# Patient Record
Sex: Female | Born: 1985 | Race: White | Hispanic: No | Marital: Married | Smoking: Former smoker
Health system: Southern US, Community
[De-identification: ages and names within clinical notes are randomized; demographics above are authoritative.]

## PROBLEM LIST (undated history)

## (undated) DIAGNOSIS — I10 Essential (primary) hypertension: Secondary | ICD-10-CM

## (undated) DIAGNOSIS — E559 Vitamin D deficiency, unspecified: Secondary | ICD-10-CM

## (undated) DIAGNOSIS — O24419 Gestational diabetes mellitus in pregnancy, unspecified control: Secondary | ICD-10-CM

## (undated) DIAGNOSIS — I499 Cardiac arrhythmia, unspecified: Secondary | ICD-10-CM

## (undated) DIAGNOSIS — K219 Gastro-esophageal reflux disease without esophagitis: Secondary | ICD-10-CM

## (undated) HISTORY — DX: Gastro-esophageal reflux disease without esophagitis: K21.9

## (undated) HISTORY — PX: NO PAST SURGERIES: SHX2092

## (undated) HISTORY — DX: Gestational diabetes mellitus in pregnancy, unspecified control: O24.419

## (undated) HISTORY — DX: Cardiac arrhythmia, unspecified: I49.9

## (undated) HISTORY — DX: Vitamin D deficiency, unspecified: E55.9

## (undated) HISTORY — DX: Essential (primary) hypertension: I10

---

## 2006-11-14 ENCOUNTER — Emergency Department: Payer: Self-pay | Admitting: Emergency Medicine

## 2012-01-13 ENCOUNTER — Ambulatory Visit: Payer: Self-pay | Admitting: Internal Medicine

## 2012-12-26 IMAGING — US US PELV - US TRANSVAGINAL
1 series · 17 of 25 positions shown · non-contrast
Comparison: none

REASON FOR EXAM: RLQ pain
COMMENTS:

[Series 1: us pelv - us transvaginal · 17 of 71 slices shown]
[im 1/71]
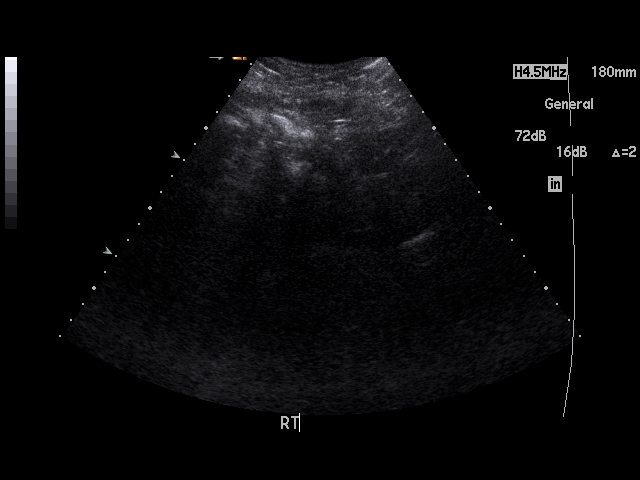
[im 6/71]
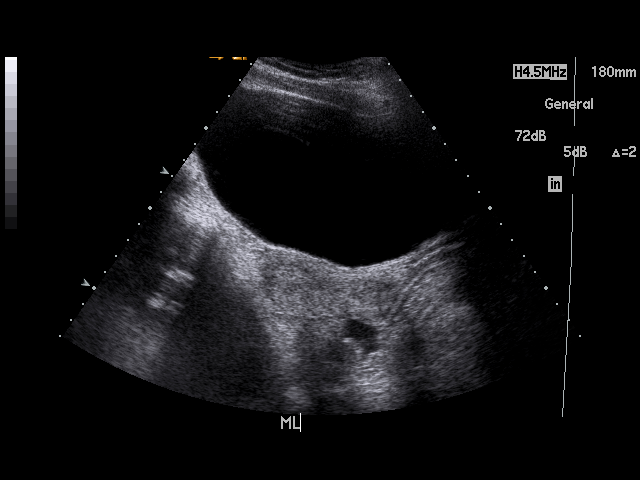
[im 9/71]
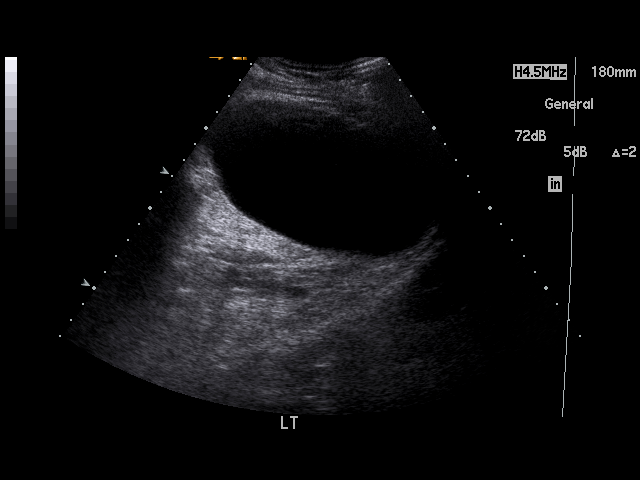
[im 15/71]
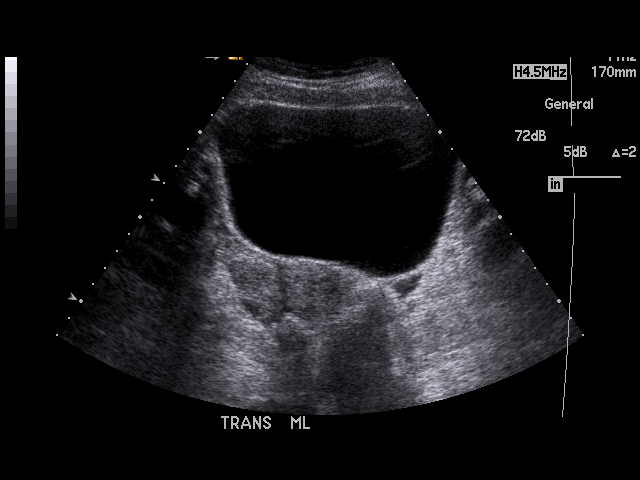
[im 18/71]
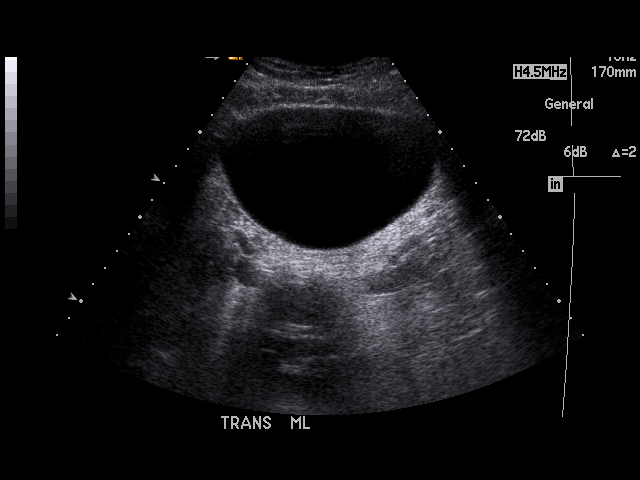
[im 24/71]
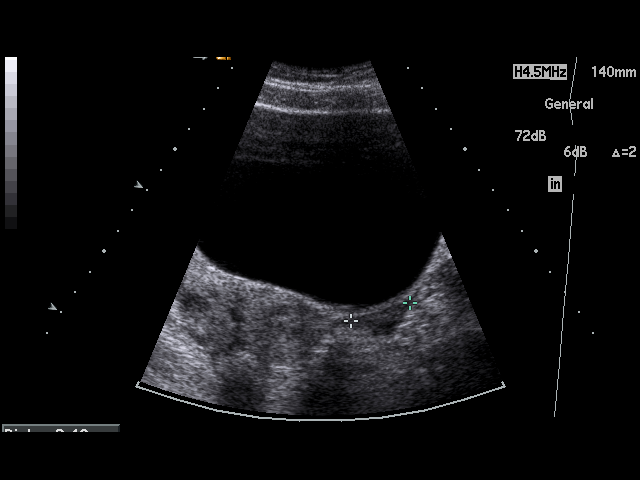
[im 27/71]
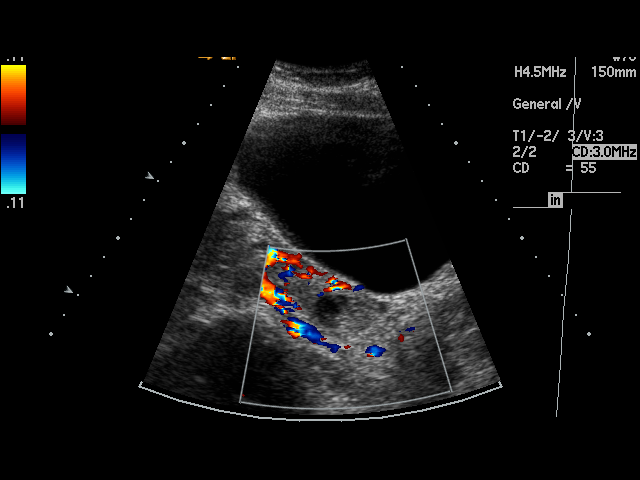
[im 33/71]
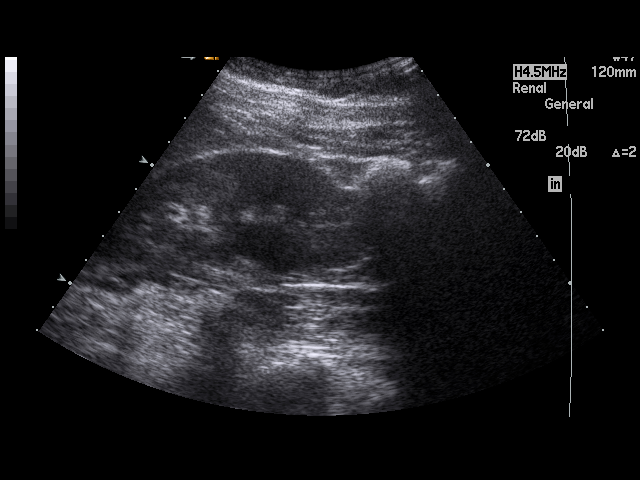
[im 36/71]
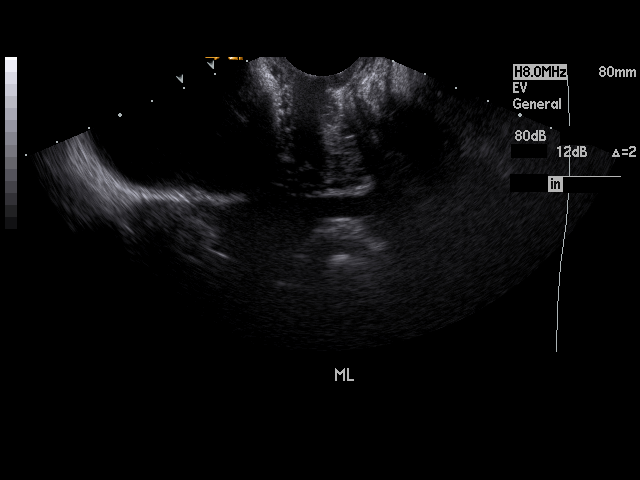
[im 38/71]
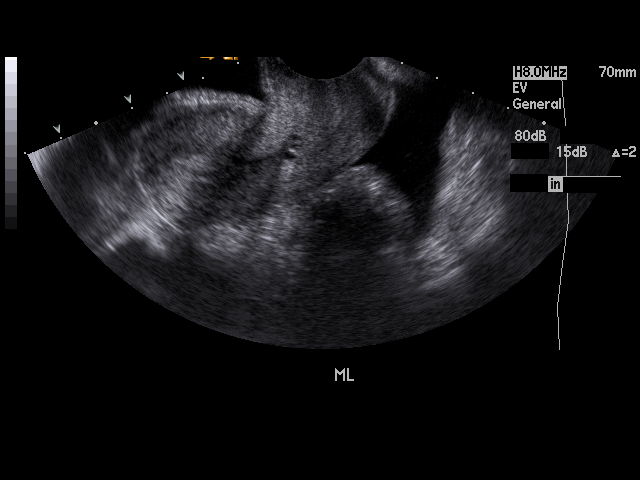
[im 44/71]
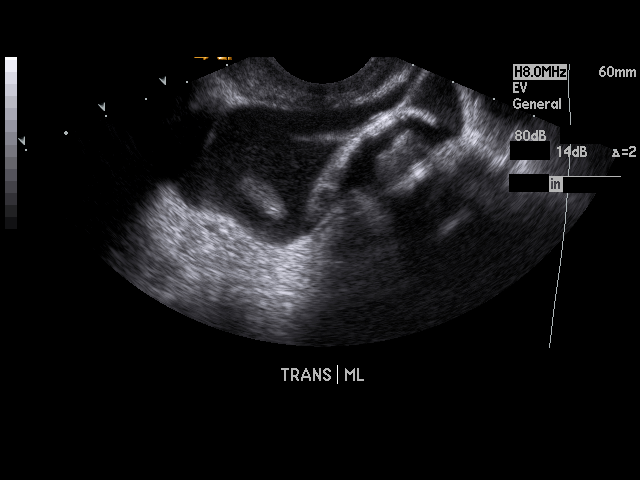
[im 47/71]
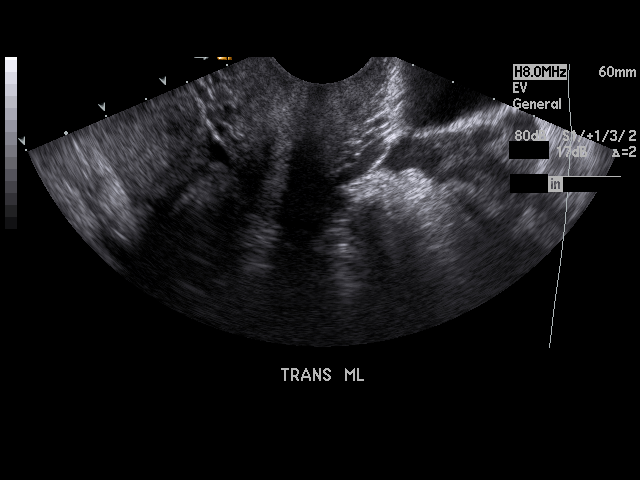
[im 53/71]
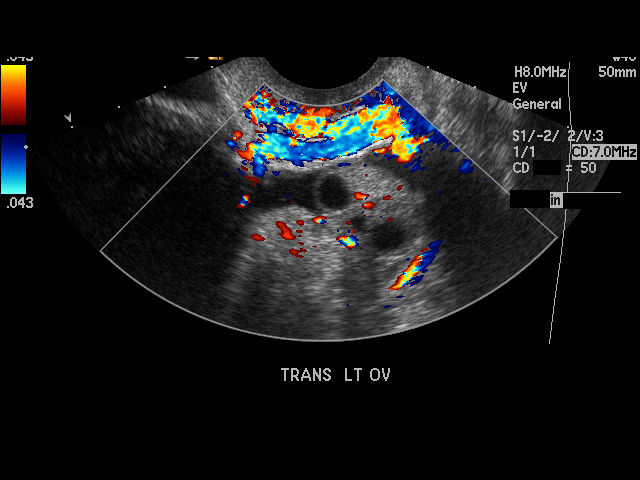
[im 56/71]
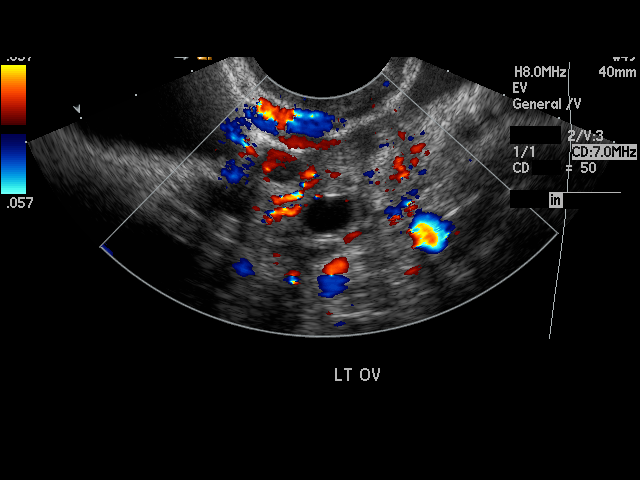
[im 62/71]
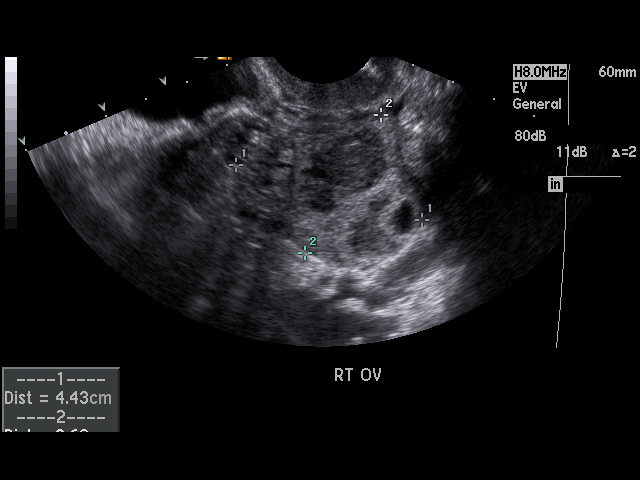
[im 65/71]
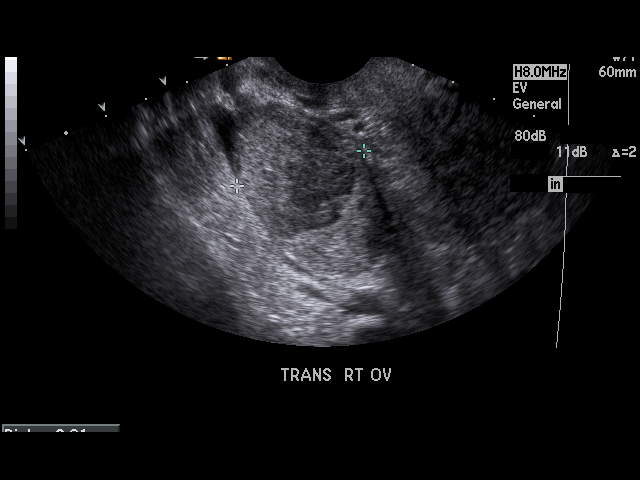
[im 71/71]
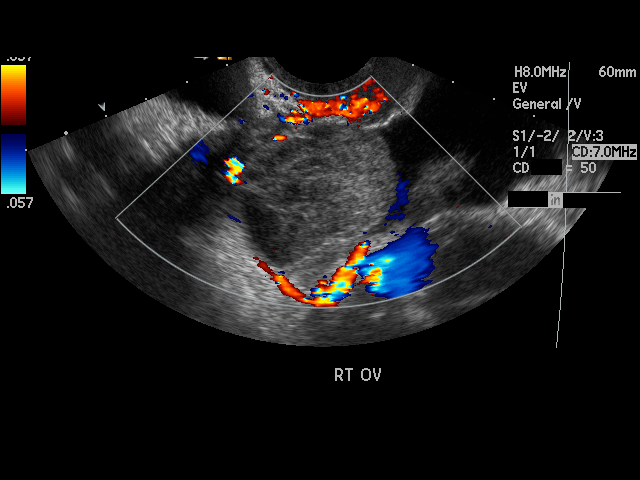

[17 of 25 positions shown; findings below may reference images not displayed]

PROCEDURE:     US  - US PELVIS EXAM W/TRANSVAGINAL  - January 13, 2012  [DATE]

RESULT:     Transabdominal and endovaginal ultrasound was performed. The
uterus measures 7.52 cm x 2.85 cm x 3.93 cm. No uterine mass is seen. The
endometrium measures 7.6 mm in thickness. The right and left ovaries are
visualized. The right ovary measures 4.43 cm at maximum diameter. There is a
3.89 cm solid mass in the right ovary. Doppler examination shows the mass to
be avascular which suggests the finding may represent a hemorrhagic cyst or
inflammatory cyst. Follow-up examination to evaluate for regression is
recommended. Alternatively, the finding could be further evaluated at this
time by MR. There are a few follicles visualized in the left ovary. A
moderately large amount of free fluid is seen in the pelvis. There is
incidentally noted a small nabothian cyst of the cervix. The kidneys are
visualized bilaterally and show no hydronephrosis. The visualized portion of
the urinary bladder is normal in appearance.
IMPRESSION: 1. There is a solid, complex mass of the right ovary. The finding is
avascular and may represent a hemorrhagic or inflammatory mass. Follow-up
examination is recommended.
2. There is a moderately large amount of free fluid observed in the pelvis.

## 2014-11-28 ENCOUNTER — Emergency Department: Payer: Self-pay | Admitting: Emergency Medicine

## 2017-08-31 ENCOUNTER — Other Ambulatory Visit: Payer: Self-pay | Admitting: Internal Medicine

## 2017-08-31 DIAGNOSIS — R109 Unspecified abdominal pain: Secondary | ICD-10-CM

## 2017-09-02 ENCOUNTER — Ambulatory Visit
Admission: RE | Admit: 2017-09-02 | Discharge: 2017-09-02 | Disposition: A | Discharge status: Home / Self Care | Payer: Medicaid Other | Source: Ambulatory Visit | Attending: Internal Medicine | Admitting: Internal Medicine

## 2017-09-11 ENCOUNTER — Ambulatory Visit
Admission: RE | Admit: 2017-09-11 | Discharge: 2017-09-11 | Disposition: A | Discharge status: Home / Self Care | Payer: Medicaid Other | Source: Ambulatory Visit | Attending: Internal Medicine | Admitting: Internal Medicine

## 2017-09-11 DIAGNOSIS — R109 Unspecified abdominal pain: Secondary | ICD-10-CM | POA: Diagnosis not present

## 2019-03-02 ENCOUNTER — Telehealth: Payer: Self-pay | Admitting: Obstetrics & Gynecology

## 2019-03-02 NOTE — Telephone Encounter (Signed)
[facility] referring for Pap smear.Attempt to reach patient phone number on file has call restrictions unable to leave message to call back to be schedule

## 2019-03-08 NOTE — Telephone Encounter (Signed)
Attempt to reach patient phone number on file has call restrictions unable to leave message to call back to be schedule

## 2019-05-11 IMAGING — US US ABDOMEN COMPLETE
1 series · 14 of 25 positions shown · non-contrast
Comparison: None.

CLINICAL DATA: Abdominal pain 3 months.

EXAM:
ABDOMEN ULTRASOUND COMPLETE

[Series 1: us abdomen complete · 0.22mm/px · 14 of 80 slices shown]
[im 1/80]
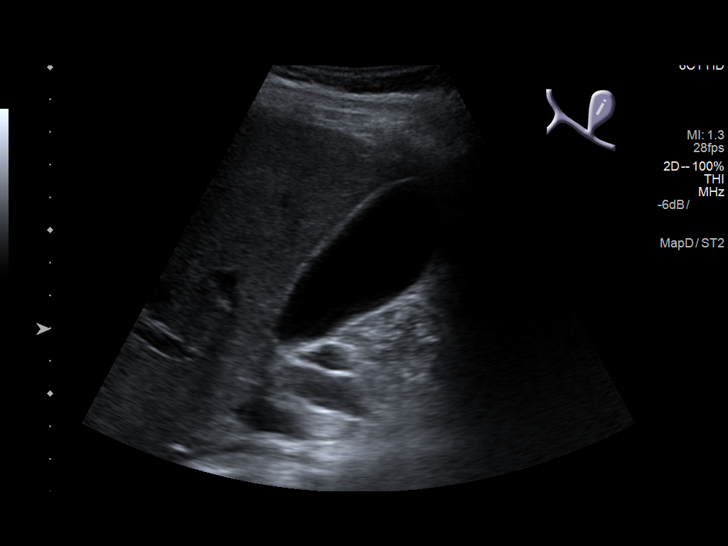
[im 7/80]
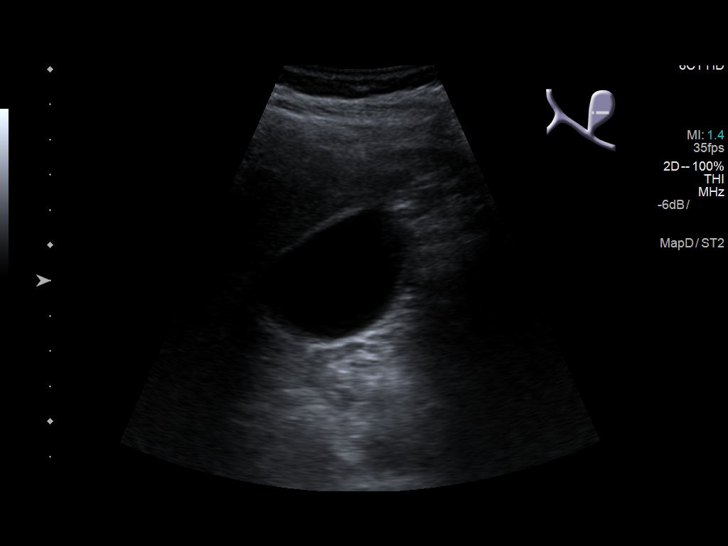
[im 14/80]
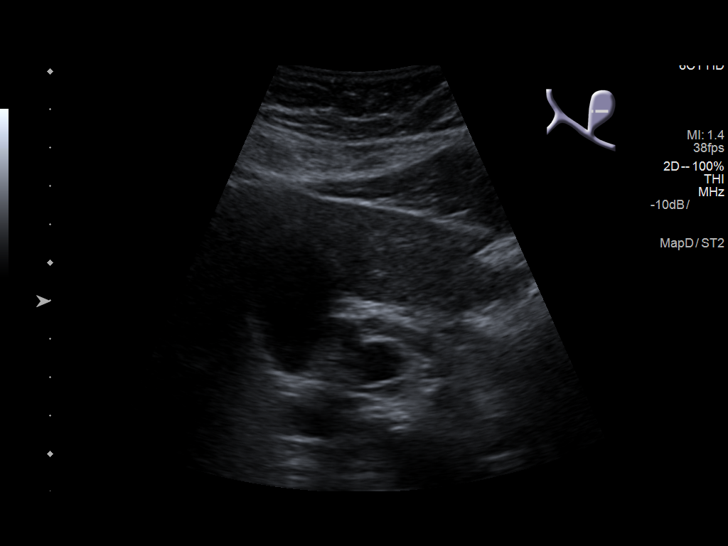
[im 20/80]
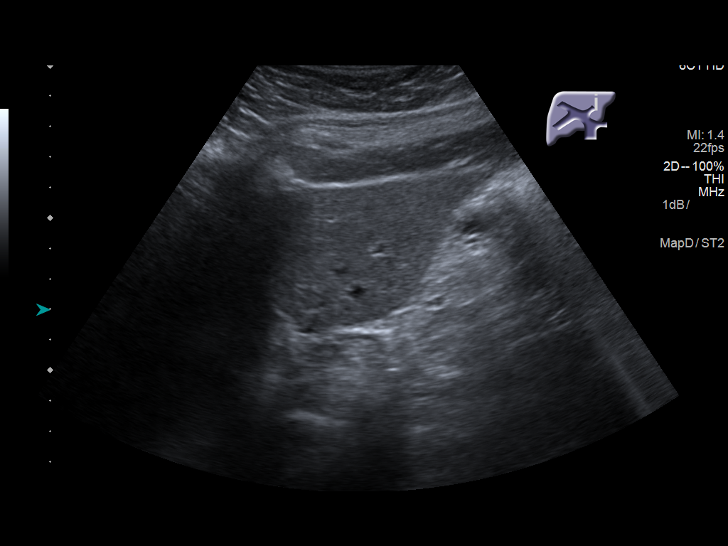
[im 27/80]
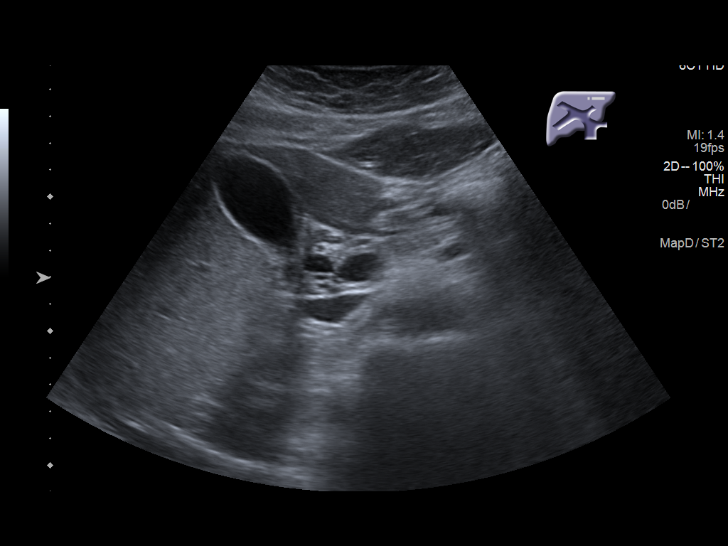
[im 30/80]
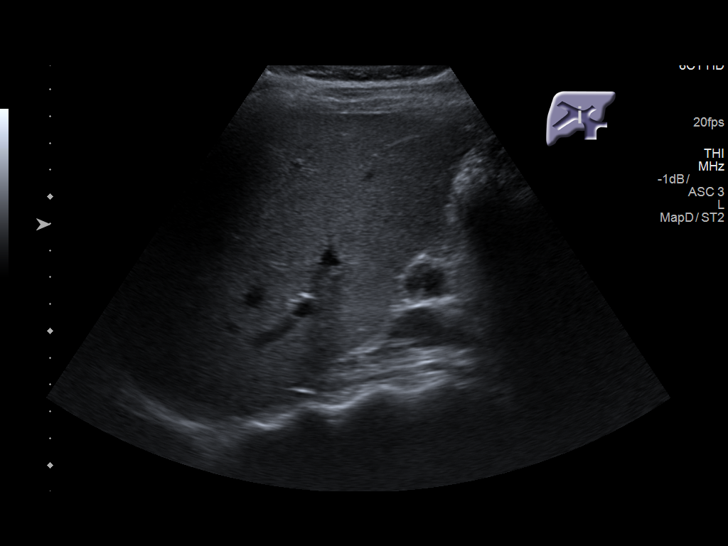
[im 37/80]
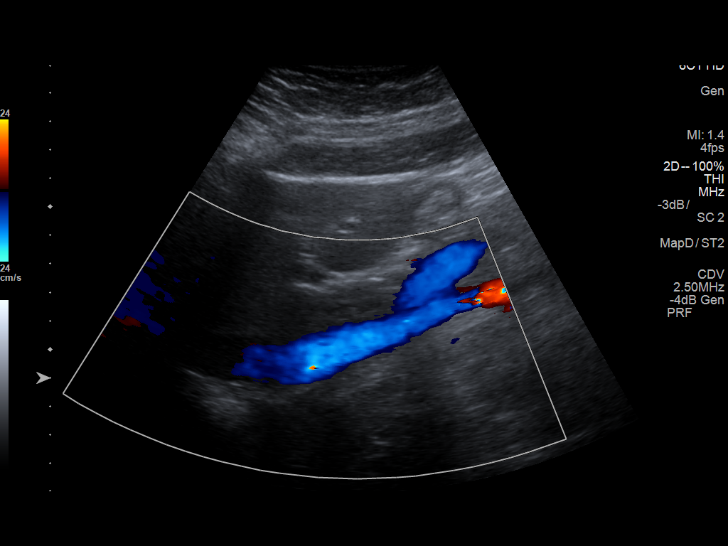
[im 43/80]
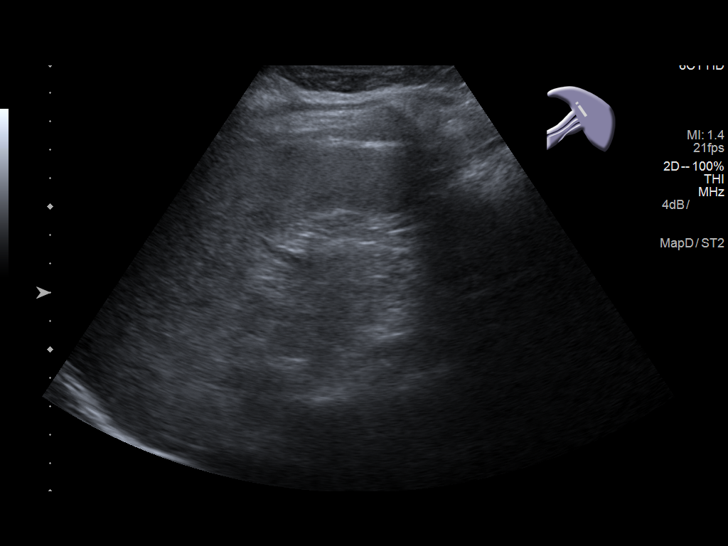
[im 50/80]
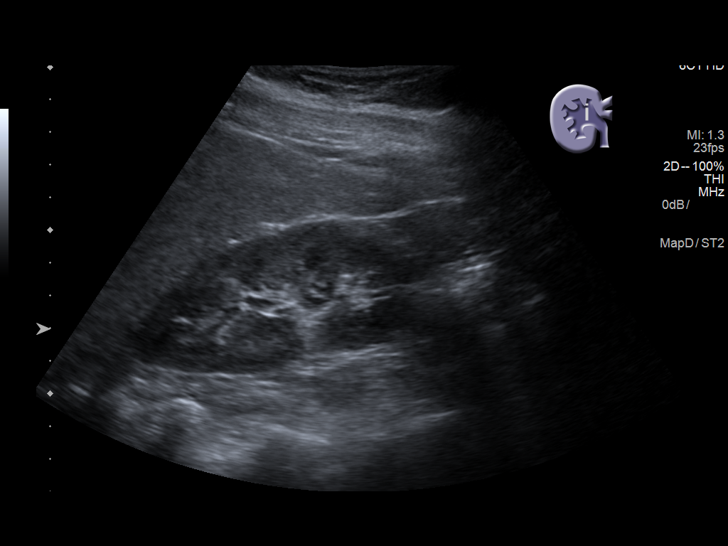
[im 53/80]
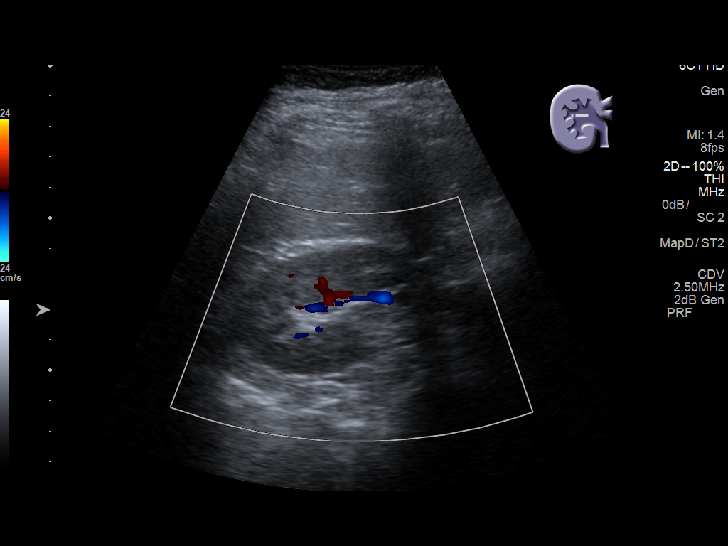
[im 60/80]
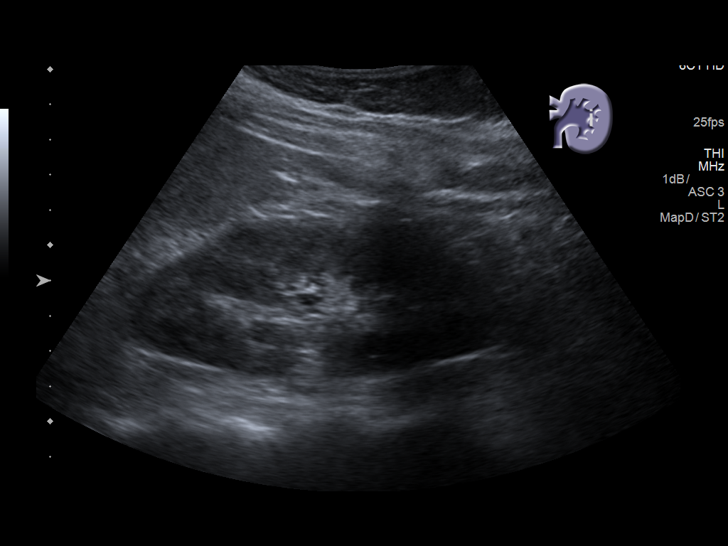
[im 66/80]
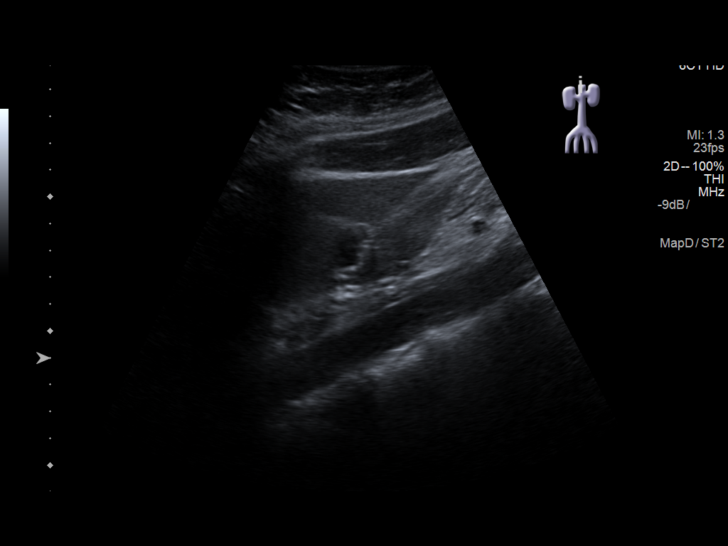
[im 73/80]
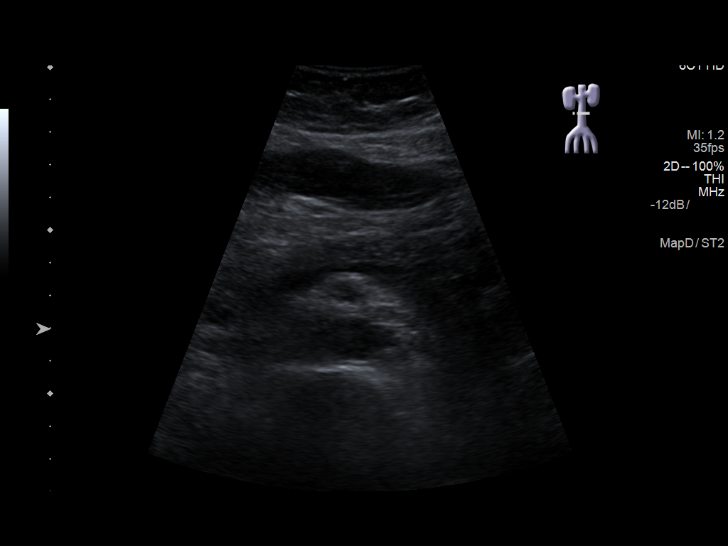
[im 80/80]
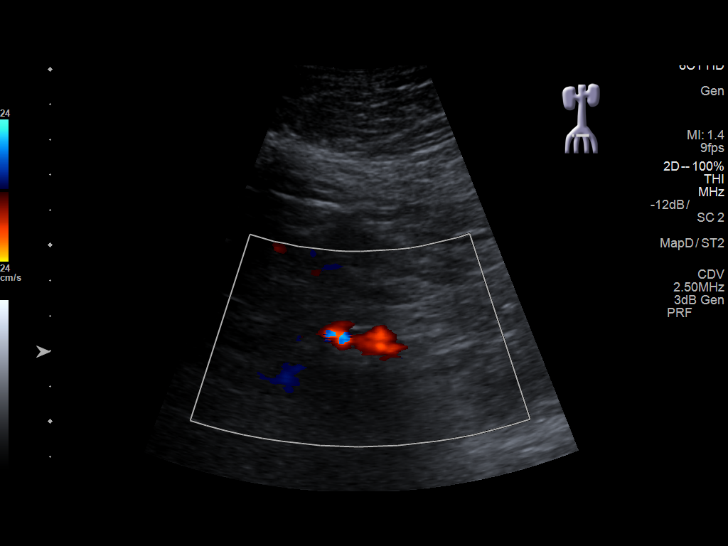

[14 of 25 positions shown; findings below may reference images not displayed]

FINDINGS: Gallbladder: No gallstones or wall thickening visualized. No
sonographic Murphy sign noted by sonographer.

Common bile duct: Diameter: 5 mm (8 mm at the head of the pancreas).

Liver: No focal lesion identified. Within normal limits in
parenchymal echogenicity. Portal vein is patent on color Doppler
imaging with normal direction of blood flow towards the liver.

IVC: No abnormality visualized.

Pancreas: Visualized portion unremarkable.

Spleen: Size and appearance within normal limits. Mild increased
volume 599 cm3.

Right Kidney: Length: 10.6 cm. Echogenicity within normal limits. No
mass or hydronephrosis visualized.

Left Kidney: Length: 10.4 cm. Echogenicity within normal limits. No
mass or hydronephrosis visualized.

Abdominal aorta: No aneurysm visualized.

Other findings: None.
IMPRESSION: Normal gallbladder. Slight dilatation of the distal common bile duct
measuring 8 mm without evidence of choledocholithiasis or adjacent
mass/ adenopathy. If symptoms persist, MRCP helpful for further
evaluation.

## 2020-09-11 ENCOUNTER — Telehealth: Payer: Self-pay | Admitting: Obstetrics & Gynecology

## 2020-09-11 NOTE — Telephone Encounter (Signed)
[facility] referring for pregnancy. Paper records. Called and left voicemail for patient to call back to be scheduled.

## 2020-09-21 ENCOUNTER — Encounter: Payer: Self-pay | Admitting: Obstetrics and Gynecology

## 2020-09-21 ENCOUNTER — Other Ambulatory Visit (HOSPITAL_COMMUNITY)
Admission: RE | Admit: 2020-09-21 | Discharge: 2020-09-21 | Disposition: A | Discharge status: Home / Self Care | Payer: Medicaid Other | Source: Ambulatory Visit | Attending: Obstetrics and Gynecology | Admitting: Obstetrics and Gynecology

## 2020-09-21 ENCOUNTER — Other Ambulatory Visit: Payer: Self-pay

## 2020-09-21 ENCOUNTER — Ambulatory Visit (INDEPENDENT_AMBULATORY_CARE_PROVIDER_SITE_OTHER): Payer: Medicaid Other | Admitting: Obstetrics and Gynecology

## 2020-09-21 VITALS — BP 110/70 | Ht 63.0 in | Wt 202.0 lb

## 2020-09-21 DIAGNOSIS — Z3A11 11 weeks gestation of pregnancy: Secondary | ICD-10-CM

## 2020-09-21 DIAGNOSIS — Z369 Encounter for antenatal screening, unspecified: Secondary | ICD-10-CM | POA: Diagnosis not present

## 2020-09-21 DIAGNOSIS — Z124 Encounter for screening for malignant neoplasm of cervix: Secondary | ICD-10-CM | POA: Insufficient documentation

## 2020-09-21 DIAGNOSIS — I10 Essential (primary) hypertension: Secondary | ICD-10-CM | POA: Insufficient documentation

## 2020-09-21 DIAGNOSIS — E669 Obesity, unspecified: Secondary | ICD-10-CM | POA: Insufficient documentation

## 2020-09-21 DIAGNOSIS — A5901 Trichomonal vulvovaginitis: Secondary | ICD-10-CM | POA: Diagnosis present

## 2020-09-21 DIAGNOSIS — Z3201 Encounter for pregnancy test, result positive: Secondary | ICD-10-CM

## 2020-09-21 DIAGNOSIS — Z8679 Personal history of other diseases of the circulatory system: Secondary | ICD-10-CM | POA: Insufficient documentation

## 2020-09-21 DIAGNOSIS — N926 Irregular menstruation, unspecified: Secondary | ICD-10-CM | POA: Insufficient documentation

## 2020-09-21 DIAGNOSIS — O23591 Infection of other part of genital tract in pregnancy, first trimester: Secondary | ICD-10-CM

## 2020-09-21 DIAGNOSIS — Z113 Encounter for screening for infections with a predominantly sexual mode of transmission: Secondary | ICD-10-CM

## 2020-09-21 DIAGNOSIS — O0990 Supervision of high risk pregnancy, unspecified, unspecified trimester: Secondary | ICD-10-CM

## 2020-09-21 LAB — POCT URINALYSIS DIPSTICK OB: Glucose, UA: NEGATIVE

## 2020-09-21 LAB — POCT URINE PREGNANCY: Preg Test, Ur: POSITIVE — AB

## 2020-09-21 NOTE — Patient Instructions (Signed)
First Trimester of Pregnancy The first trimester of pregnancy is from week 1 until the end of week 13 (months 1 through 3). A week after a sperm fertilizes an egg, the egg will implant on the wall of the uterus. This embryo will begin to develop into a baby. Genes from you and your partner will form the baby. The female genes will determine whether the baby will be a boy or a girl. At 6-8 weeks, the eyes and face will be formed, and the heartbeat can be seen on ultrasound. At the end of 12 weeks, all the baby's organs will be formed. Now that you are pregnant, you will want to do everything you can to have a healthy baby. Two of the most important things are to get good prenatal care and to follow your health care provider's instructions. Prenatal care is all the medical care you receive before the baby's birth. This care will help prevent, find, and treat any problems during the pregnancy and childbirth. Body changes during your first trimester Your body goes through many changes during pregnancy. The changes vary from woman to woman.  You may gain or lose a couple of pounds at first.  You may feel sick to your stomach (nauseous) and you may throw up (vomit). If the vomiting is uncontrollable, call your health care provider.  You may tire easily.  You may develop headaches that can be relieved by medicines. All medicines should be approved by your health care provider.  You may urinate more often. Painful urination may mean you have a bladder infection.  You may develop heartburn as a result of your pregnancy.  You may develop constipation because certain hormones are causing the muscles that push stool through your intestines to slow down.  You may develop hemorrhoids or swollen veins (varicose veins).  Your breasts may begin to grow larger and become tender. Your nipples may stick out more, and the tissue that surrounds them (areola) may become darker.  Your gums may bleed and may be  sensitive to brushing and flossing.  Dark spots or blotches (chloasma, mask of pregnancy) may develop on your face. This will likely fade after the baby is born.  Your menstrual periods will stop.  You may have a loss of appetite.  You may develop cravings for certain kinds of food.  You may have changes in your emotions from day to day, such as being excited to be pregnant or being concerned that something may go wrong with the pregnancy and baby.  You may have more vivid and strange dreams.  You may have changes in your hair. These can include thickening of your hair, rapid growth, and changes in texture. Some women also have hair loss during or after pregnancy, or hair that feels dry or thin. Your hair will most likely return to normal after your baby is born. What to expect at prenatal visits During a routine prenatal visit:  You will be weighed to make sure you and the baby are growing normally.  Your blood pressure will be taken.  Your abdomen will be measured to track your baby's growth.  The fetal heartbeat will be listened to between weeks 10 and 14 of your pregnancy.  Test results from any previous visits will be discussed. Your health care provider may ask you:  How you are feeling.  If you are feeling the baby move.  If you have had any abnormal symptoms, such as leaking fluid, bleeding, severe headaches, or abdominal   cramping.  If you are using any tobacco products, including cigarettes, chewing tobacco, and electronic cigarettes.  If you have any questions. Other tests that may be performed during your first trimester include:  Blood tests to find your blood type and to check for the presence of any previous infections. The tests will also be used to check for low iron levels (anemia) and protein on red blood cells (Rh antibodies). Depending on your risk factors, or if you previously had diabetes during pregnancy, you may have tests to check for high blood sugar  that affects pregnant women (gestational diabetes).  Urine tests to check for infections, diabetes, or protein in the urine.  An ultrasound to confirm the proper growth and development of the baby.  Fetal screens for spinal cord problems (spina bifida) and Down syndrome.  HIV (human immunodeficiency virus) testing. Routine prenatal testing includes screening for HIV, unless you choose not to have this test.  You may need other tests to make sure you and the baby are doing well. Follow these instructions at home: Medicines  Follow your health care provider's instructions regarding medicine use. Specific medicines may be either safe or unsafe to take during pregnancy.  Take a prenatal vitamin that contains at least 600 micrograms (mcg) of folic acid.  If you develop constipation, try taking a stool softener if your health care provider approves. Eating and drinking   Eat a balanced diet that includes fresh fruits and vegetables, whole grains, good sources of protein such as meat, eggs, or tofu, and low-fat dairy. Your health care provider will help you determine the amount of weight gain that is right for you.  Avoid raw meat and uncooked cheese. These carry germs that can cause birth defects in the baby.  Eating four or five small meals rather than three large meals a day may help relieve nausea and vomiting. If you start to feel nauseous, eating a few soda crackers can be helpful. Drinking liquids between meals, instead of during meals, also seems to help ease nausea and vomiting.  Limit foods that are high in fat and processed sugars, such as fried and sweet foods.  To prevent constipation: ? Eat foods that are high in fiber, such as fresh fruits and vegetables, whole grains, and beans. ? Drink enough fluid to keep your urine clear or pale yellow. Activity  Exercise only as directed by your health care provider. Most women can continue their usual exercise routine during  pregnancy. Try to exercise for 30 minutes at least 5 days a week. Exercising will help you: ? Control your weight. ? Stay in shape. ? Be prepared for labor and delivery.  Experiencing pain or cramping in the lower abdomen or lower back is a good sign that you should stop exercising. Check with your health care provider before continuing with normal exercises.  Try to avoid standing for long periods of time. Move your legs often if you must stand in one place for a long time.  Avoid heavy lifting.  Wear low-heeled shoes and practice good posture.  You may continue to have sex unless your health care provider tells you not to. Relieving pain and discomfort  Wear a good support bra to relieve breast tenderness.  Take warm sitz baths to soothe any pain or discomfort caused by hemorrhoids. Use hemorrhoid cream if your health care provider approves.  Rest with your legs elevated if you have leg cramps or low back pain.  If you develop varicose veins in   your legs, wear support hose. Elevate your feet for 15 minutes, 3-4 times a day. Limit salt in your diet. Prenatal care  Schedule your prenatal visits by the twelfth week of pregnancy. They are usually scheduled monthly at first, then more often in the last 2 months before delivery.  Write down your questions. Take them to your prenatal visits.  Keep all your prenatal visits as told by your health care provider. This is important. Safety  Wear your seat belt at all times when driving.  Make a list of emergency phone numbers, including numbers for family, friends, the hospital, and police and fire departments. General instructions  Ask your health care provider for a referral to a local prenatal education class. Begin classes no later than the beginning of month 6 of your pregnancy.  Ask for help if you have counseling or nutritional needs during pregnancy. Your health care provider can offer advice or refer you to specialists for help  with various needs.  Do not use hot tubs, steam rooms, or saunas.  Do not douche or use tampons or scented sanitary pads.  Do not cross your legs for long periods of time.  Avoid cat litter boxes and soil used by cats. These carry germs that can cause birth defects in the baby and possibly loss of the fetus by miscarriage or stillbirth.  Avoid all smoking, herbs, alcohol, and medicines not prescribed by your health care provider. Chemicals in these products affect the formation and growth of the baby.  Do not use any products that contain nicotine or tobacco, such as cigarettes and e-cigarettes. If you need help quitting, ask your health care provider. You may receive counseling support and other resources to help you quit.  Schedule a dentist appointment. At home, brush your teeth with a soft toothbrush and be gentle when you floss. Contact a health care provider if:  You have dizziness.  You have mild pelvic cramps, pelvic pressure, or nagging pain in the abdominal area.  You have persistent nausea, vomiting, or diarrhea.  You have a bad smelling vaginal discharge.  You have pain when you urinate.  You notice increased swelling in your face, hands, legs, or ankles.  You are exposed to fifth disease or chickenpox.  You are exposed to German measles (rubella) and have never had it. Get help right away if:  You have a fever.  You are leaking fluid from your vagina.  You have spotting or bleeding from your vagina.  You have severe abdominal cramping or pain.  You have rapid weight gain or loss.  You vomit blood or material that looks like coffee grounds.  You develop a severe headache.  You have shortness of breath.  You have any kind of trauma, such as from a fall or a car accident. Summary  The first trimester of pregnancy is from week 1 until the end of week 13 (months 1 through 3).  Your body goes through many changes during pregnancy. The changes vary from  woman to woman.  You will have routine prenatal visits. During those visits, your health care provider will examine you, discuss any test results you may have, and talk with you about how you are feeling. This information is not intended to replace advice given to you by your health care provider. Make sure you discuss any questions you have with your health care provider. Document Revised: 09/18/2017 Document Reviewed: 09/17/2016 [employer] Patient Education  2020 [employer]  

## 2020-09-21 NOTE — Progress Notes (Signed)
New Obstetric Patient H&P    Chief Complaint: Establish prenatal care   History of Present Illness: Patient is a 34 y.o. G1P0 Not Hispanic or Latino female, presents with amenorrhea and positive pregnancy test 11/22 at doctor's office visit for cold symptoms. Patient reports her last menstrual period was 07/03/2020, although patient reports sure is not sure about "exactly when" her LMP was. Based on her  LMP, her EDD is Estimated Date of Delivery: 04/09/21 and her EGA is [redacted]w[redacted]d. Cycles are 2-4 days, irregular, and occur approximately every : month, per patient - although she reports intermittently "skipping months." days. Her last pap smear was 5 or 6 years ago and was not abnormal - per patient report.    She had a urine pregnancy test which was positive 1 or 2 week(s)  ago. Her last menstrual period was "short" and lasted for  approximately 2-3 days day(s). Since her LMP she claims she has experienced increased nausea. She denies vaginal bleeding. Her past medical history is contibutory. She reports being treated for cHTN within the last year and a history of "irregular heartbeat" since childhood. Patient unable to give additional information regarding this diagnosis.   Since her LMP, she admits to the use of tobacco products  no She claims she has gained  6 pounds since the start of her pregnancy.  There are cats in the home in the home  yes If yes Indoor She admits close contact with children on a regular basis  no  She has had chicken pox in the past yes She has had Tuberculosis exposures, symptoms, or previously tested positive for TB   no Current or past history of domestic violence. no  Genetic Screening/Teratology Counseling: (Includes patient, baby's father, or anyone in either family with:)   1. Patient's age >/= 51 [facility]  yes 2. Thalassemia ([place], [place], Mediterranean, or Asian background): MCV<80  no 3. Neural tube defect (meningomyelocele, spina bifida, anencephaly)  no 4.  Congenital heart defect  - patient "can't remember" diagnosis associated with hx of irregular heartbeat but denies hx of structural  anomalies 5. Down syndrome  no 6. Tay-Sachs (Jewish, [place]  no 7. Canavan's Disease  no 8. Sickle cell disease or trait (African)  no  9. Hemophilia or other blood disorders  no  10. Muscular dystrophy  no  11. Cystic fibrosis  no  12. Huntington's Chorea  no  13. Mental retardation/autism  no 14. Other inherited genetic or chromosomal disorder  no 15. Maternal metabolic disorder (DM, PKU, etc)  no 16. Patient or FOB with a child with a birth defect not listed above no  16a. Patient or FOB with a birth defect themselves no 17. Recurrent pregnancy loss, or stillbirth  no  18. Any medications since LMP other than prenatal vitamins (include vitamins, supplements, OTC meds, drugs, alcohol)  yes 19. Any other genetic/environmental exposure to discuss  no  Infection History:   1. Lives with someone with TB or TB exposed  no  2. Patient or partner has history of genital herpes  no 3. Rash or viral illness since LMP  no 4. History of STI (GC, CT, HPV, syphilis, HIV)  Patient reports recent treatment for trich, denies further hx of STIs 5. History of recent travel :  no  Other pertinent information:  Patient has difficult time remembering details regarding health history. Partner "Molly Maduro" present for exam. Patient reports she is currently unemployed.    Review of Systems:10 point review of  systems negative unless otherwise noted in HPI  Past Medical History:  Patient Active Problem List   Diagnosis Date Noted  . Irregular periods 09/21/2020  . Chronic hypertension 09/21/2020    [ ]  Aspirin 81 mg daily after 12 weeks; discontinue after 36 weeks [ ]  baseline labs with CBC, CMP, urine protein/creatinine ratio [ ]  no BP meds unless BPs become elevated [ ]  ultrasound for growth at 28, 32, 36 weeks [ ]  Aspirin 81 mg daily after 12 weeks; discontinue  after 36 weeks [ ]  Baseline EKG   Current antihypertensives:  amlodipine  Baseline and surveillance labs (pulled in from EPIC, refresh links as needed)  No results found for: PLT, CREATININE, AST, ALT, PROTCRRATIO, LABPROT, PROTEIN24HR  Antenatal Testing CHTN - O10.919  Group I  BP < 140/90, no preeclampsia, AGA,  nml AFV, +/- meds    Group II BP > 140/90, on meds, no preeclampsia, AGA, nml AFV  20-28-34-38  20-24-28-32-35-38  32//2 x wk  28//BPP wkly then 32//2 x wk  40 no meds; 39 meds  PRN or 37  Pre-eclampsia  GHTN - O13.9/Preeclampsia without severe features  - O14.00   Preeclampsia with severe features - O14.10  Q 3-4wks  Q 2 wks  28//BPP wkly then 32//2 x wk  Inpatient  37  PRN or 34      . History of irregular heartbeat 09/21/2020  . Supervision of high risk pregnancy, antepartum 09/21/2020    Clinic [facility] Prenatal Labs  Dating  Blood type:     Genetic Screen 1 Screen:    AFP:     Quad:     NIPS: Antibody:   Anatomic  Rubella:   Varicella: @VZVIGG @  GTT Early:               Third trimester:  RPR:     Rhogam  HBsAg:     TDaP vaccine                       Flu Shot: HIV:     Baby Food                                GBS:   Contraception  Pap:  CBB     CS/VBAC    Support Person  Robert       . Obesity (BMI 30-39.9) 09/21/2020    BMI >=35.0-39.9 [ ]  early 1h gtt - ordered  [ ]  u/s for dating [ ]  - ordered [x ] nutritional goals - reviewed recommendations [ ]  folic acid 1mg  [ ]  bASA (>12 weeks) [ ]  consider nutrition consult [ ]  consider maternal EKG 1st trimester - ordered 12/3 [ ]  Growth u/s 28 [ ] , 32 [ ] , 36 weeks [ ]  [ ]  NST/AFI weekly 37+ weeks (37[] , 38[] , 39[] , 40[] ) [ ]  IOL by 41 weeks (scheduled, prn [] )      Past Surgical History:  History reviewed. No pertinent surgical history.  Gynecologic History: Patient's last menstrual period was 07/03/2020.  Obstetric History: G1P0  Family History:  Family History  Problem  Relation Age of Onset  . Cancer Mother   . Cancer Maternal Grandmother     Social History:  Social History   Socioeconomic History  . Marital status: Married    Spouse name: Not on file  . Number of children: Not on file  . Years of education: Not on file  .  Highest education level: Not on file  Occupational History  . Not on file  Tobacco Use  . Smoking status: Never Smoker  . Smokeless tobacco: Never Used  Vaping Use  . Vaping Use: Never used  Substance and Sexual Activity  . Alcohol use: Never  . Drug use: Never  . Sexual activity: Yes  Other Topics Concern  . Not on file  Social History Narrative  . Not on file   Social Determinants of Health   Financial Resource Strain:   . Difficulty of Paying Living Expenses: Not on file  Food Insecurity:   . Worried About Programme researcher, broadcasting/film/video in the Last Year: Not on file  . Ran Out of Food in the Last Year: Not on file  Transportation Needs:   . Lack of Transportation (Medical): Not on file  . Lack of Transportation (Non-Medical): Not on file  Physical Activity:   . Days of Exercise per Week: Not on file  . Minutes of Exercise per Session: Not on file  Stress:   . Feeling of Stress : Not on file  Social Connections:   . Frequency of Communication with Friends and Family: Not on file  . Frequency of Social Gatherings with Friends and Family: Not on file  . Attends Religious Services: Not on file  . Active Member of Clubs or Organizations: Not on file  . Attends Banker Meetings: Not on file  . Marital Status: Not on file  Intimate Partner Violence:   . Fear of Current or Ex-Partner: Not on file  . Emotionally Abused: Not on file  . Physically Abused: Not on file  . Sexually Abused: Not on file    Allergies:  Not on File  Medications: Prior to Admission medications   Medication Sig Start Date End Date Taking? Authorizing Provider  amLODipine (NORVASC) 2.5 MG tablet Take 2.5 mg by mouth daily.  08/31/20   [provider]  loratadine (CLARITIN) 10 MG tablet Take 10 mg by mouth daily. 08/03/20   [provider]  meclizine (ANTIVERT) 25 MG tablet Take 25 mg by mouth 2 (two) times daily as needed. 08/23/20   [provider]  omeprazole (PRILOSEC) 40 MG capsule Take 40 mg by mouth daily. 08/03/20   [provider]  Vitamin D, Ergocalciferol, (DRISDOL) 1.25 MG (50000 UNIT) CAPS capsule Take by mouth. 05/24/20   [provider]    Physical Exam Vitals: Blood pressure 110/70, height 5\' 3"  (1.6 m), weight 202 lb (91.6 kg), last menstrual period 07/03/2020.  General: NAD HEENT: normocephalic, anicteric Thyroid: no enlargement, no palpable nodules Pulmonary: No increased work of breathing, CTAB Cardiovascular: RRR, distal pulses 2+ (irregular rhythm not noted on exam) Abdomen: NABS, soft, non-tender, non-distended.  Umbilicus without lesions.  No hepatomegaly, splenomegaly or masses palpable. No evidence of hernia  Genitourinary:  External: Normal external female genitalia.  Normal urethral meatus, normal  Bartholin's and Skene's glands.    Vagina: Normal vaginal mucosa, no evidence of prolapse.    Cervix: Grossly normal in appearance, no bleeding  Uterus: Enlarged (size consistent with [redacted] weeks gestation), mobile, normal contour.  No CMT  Adnexa: ovaries non-enlarged, no adnexal masses  Rectal: deferred Extremities: no edema, erythema, or tenderness Neurologic: Grossly intact Psychiatric: mood appropriate, affect full   Assessment: 34 y.o. G1P0 at [redacted]w[redacted]d presenting to initiate prenatal care  Plan:  1) Hx of cHTN - records requested from outside PCP - referral to cardiology for EKG 2) Hx of "irregular heartbeat" -  records requested from outside PCP - referral to cardiology for EKG 3) Obesity in pregnancy- 1h GTT at next visit  4) Avoid alcoholic beverages. 5) Patient encouraged not to smoke.  6) Discontinue the use of all non-medicinal  drugs and chemicals.  7) Take prenatal vitamins daily.  8) Nutrition, food safety (fish, cheese advisories, and high nitrite foods) and exercise discussed. 9) Hospital and practice style discussed with cross coverage system.  10) Genetic Screening, such as with 1st Trimester Screening, cell free fetal DNA, AFP testing, and Ultrasound, as well as with amniocentesis and CVS as appropriate, is discussed with patient. At the conclusion of today's visit patient requested genetic testing - Patient desires NIPTs testing 11) Patient is asked about travel to areas at risk for the Zika virus, and counseled to avoid travel and exposure to mosquitoes or sexual partners who may have themselves been exposed to the virus. Testing is discussed, and will be ordered as appropriate.   Kristen PlantsKatelyn Elwin Jefferson, CNM, MSN [facility], [facility] 09/21/2020, 9:27 AM

## 2020-09-22 LAB — RPR+RH+ABO+RUB AB+AB SCR+CB...
Antibody Screen: NEGATIVE
HIV Screen 4th Generation wRfx: NONREACTIVE
Hematocrit: 35.1 % (ref 34.0–46.6)
Hemoglobin: 11.8 g/dL (ref 11.1–15.9)
Hepatitis B Surface Ag: NEGATIVE
MCH: 29.3 pg (ref 26.6–33.0)
MCHC: 33.6 g/dL (ref 31.5–35.7)
MCV: 87 fL (ref 79–97)
Platelets: 194 10*3/uL (ref 150–450)
RBC: 4.03 x10E6/uL (ref 3.77–5.28)
RDW: 13.3 % (ref 11.7–15.4)
RPR Ser Ql: NONREACTIVE
Rh Factor: POSITIVE
Rubella Antibodies, IGG: <0.9 index — ABNORMAL LOW (ref >0.99)
Varicella zoster IgG: 594 index (ref >165)
WBC: 8.3 10*3/uL (ref 3.4–10.8)

## 2020-09-22 LAB — COMPREHENSIVE METABOLIC PANEL
ALT: 9 IU/L (ref 0–32)
AST: 14 IU/L (ref 0–40)
Albumin/Globulin Ratio: 1.7 (ref 1.2–2.2)
Albumin: 4.3 g/dL (ref 3.8–4.8)
Alkaline Phosphatase: 129 IU/L — ABNORMAL HIGH (ref 44–121)
BUN/Creatinine Ratio: 19 (ref 9–23)
BUN: 11 mg/dL (ref 6–20)
Bilirubin Total: 0.4 mg/dL (ref 0.0–1.2)
CO2: 18 mmol/L — ABNORMAL LOW (ref 20–29)
Calcium: 9.3 mg/dL (ref 8.7–10.2)
Chloride: 100 mmol/L (ref 96–106)
Creatinine, Ser: 0.58 mg/dL (ref 0.57–1.00)
GFR calc Af Amer: 138 mL/min/{1.73_m2} (ref >59)
GFR calc non Af Amer: 120 mL/min/{1.73_m2} (ref >59)
Globulin, Total: 2.6 g/dL (ref 1.5–4.5)
Glucose: 101 mg/dL — ABNORMAL HIGH (ref 65–99)
Potassium: 3.8 mmol/L (ref 3.5–5.2)
Sodium: 135 mmol/L (ref 134–144)
Total Protein: 6.9 g/dL (ref 6.0–8.5)

## 2020-09-22 LAB — PROTEIN / CREATININE RATIO, URINE
Creatinine, Urine: 196.8 mg/dL
Protein, Ur: 55.7 mg/dL
Protein/Creat Ratio: 283 mg/g creat — ABNORMAL HIGH (ref 0–200)

## 2020-09-23 LAB — URINE CULTURE

## 2020-09-25 LAB — CYTOLOGY - PAP
Comment: NEGATIVE
Diagnosis: UNDETERMINED — AB
High risk HPV: NEGATIVE

## 2020-09-25 LAB — CERVICOVAGINAL ANCILLARY ONLY
Bacterial Vaginitis (gardnerella): NEGATIVE
Candida Glabrata: NEGATIVE
Candida Vaginitis: POSITIVE — AB
Comment: NEGATIVE
Comment: NEGATIVE
Comment: NEGATIVE
Comment: NEGATIVE
Trichomonas: POSITIVE — AB

## 2020-09-26 LAB — URINE DRUG PANEL 7
Amphetamines, Urine: NEGATIVE ng/mL
Barbiturate Quant, Ur: NEGATIVE ng/mL
Benzodiazepine Quant, Ur: NEGATIVE ng/mL
Cannabinoid Quant, Ur: NEGATIVE ng/mL
Cocaine (Metab.): NEGATIVE ng/mL
Opiate Quant, Ur: NEGATIVE ng/mL
PCP Quant, Ur: NEGATIVE ng/mL

## 2020-09-28 ENCOUNTER — Telehealth: Payer: Self-pay | Admitting: Obstetrics and Gynecology

## 2020-09-28 ENCOUNTER — Telehealth: Payer: Self-pay

## 2020-09-28 DIAGNOSIS — O0990 Supervision of high risk pregnancy, unspecified, unspecified trimester: Secondary | ICD-10-CM

## 2020-09-28 DIAGNOSIS — A5901 Trichomonal vulvovaginitis: Secondary | ICD-10-CM

## 2020-09-28 DIAGNOSIS — O23591 Infection of other part of genital tract in pregnancy, first trimester: Secondary | ICD-10-CM

## 2020-09-28 DIAGNOSIS — B3731 Acute candidiasis of vulva and vagina: Secondary | ICD-10-CM

## 2020-09-28 DIAGNOSIS — B373 Candidiasis of vulva and vagina: Secondary | ICD-10-CM

## 2020-09-28 MED ORDER — TERCONAZOLE 0.4 % VA CREA: 1.0000 | TOPICAL_CREAM | Freq: Every day | VAGINAL | 1 refills | Status: DC

## 2020-09-28 MED ORDER — CITRANATAL ASSURE 35-1 & 300 MG PO MISC: 2.0000 | Freq: Every day | ORAL | 3 refills | Status: DC

## 2020-09-28 MED ORDER — METRONIDAZOLE 500 MG PO TABS: 500.0000 mg | ORAL_TABLET | Freq: Two times a day (BID) | ORAL | 1 refills | Status: AC

## 2020-09-28 NOTE — Telephone Encounter (Signed)
Spoke with patient regarding positive trichomoniasis and candida results from NOB visit. Provided counseling regarding trichomoniasis transmission and need for partner treatment. Discussed safe sex practices during treatment course. Patient stated understanding. Rx prescribed.

## 2020-09-28 NOTE — Telephone Encounter (Signed)
Pt calling; states someone called on [facility]sb's phone and lm for pt to return call.  [phone]

## 2020-09-28 NOTE — Telephone Encounter (Signed)
Yes. Thanks - I'll call her back.

## 2020-10-05 ENCOUNTER — Other Ambulatory Visit: Payer: Self-pay

## 2020-10-05 ENCOUNTER — Encounter: Payer: Self-pay | Admitting: Cardiology

## 2020-10-05 ENCOUNTER — Ambulatory Visit: Payer: Medicaid Other | Admitting: Cardiology

## 2020-10-05 ENCOUNTER — Ambulatory Visit (INDEPENDENT_AMBULATORY_CARE_PROVIDER_SITE_OTHER): Payer: Medicaid Other

## 2020-10-05 VITALS — BP 120/70 | HR 100 | Ht 63.0 in | Wt 200.0 lb

## 2020-10-05 DIAGNOSIS — I10 Essential (primary) hypertension: Secondary | ICD-10-CM

## 2020-10-05 DIAGNOSIS — Z3490 Encounter for supervision of normal pregnancy, unspecified, unspecified trimester: Secondary | ICD-10-CM

## 2020-10-05 DIAGNOSIS — R002 Palpitations: Secondary | ICD-10-CM

## 2020-10-05 DIAGNOSIS — I38 Endocarditis, valve unspecified: Secondary | ICD-10-CM

## 2020-10-05 NOTE — Patient Instructions (Signed)
Medication Instructions:   Your physician recommends that you continue on your current medications as directed. Please refer to the Current Medication list given to you today.  *If you need a refill on your cardiac medications before your next appointment, please call your pharmacy*   Lab Work: None Ordered If you have labs (blood work) drawn today and your tests are completely normal, you will receive your results only by: . MyChart Message (if you have MyChart) OR . A paper copy in the mail If you have any lab test that is abnormal or we need to change your treatment, we will call you to review the results.   Testing/Procedures:  1.  Your physician has requested that you have an echocardiogram. Echocardiography is a painless test that uses sound waves to create images of your heart. It provides your doctor with information about the size and shape of your heart and how well your heart's chambers and valves are working. This procedure takes approximately one hour. There are no restrictions for this procedure.  2.  Your physician has recommended that you wear a Zio monitor for 2 weeks. This monitor is a medical device that records the heart's electrical activity. Doctors most often use these monitors to diagnose arrhythmias. Arrhythmias are problems with the speed or rhythm of the heartbeat. The monitor is a small device applied to your chest. You can wear one while you do your normal daily activities. While wearing this monitor if you have any symptoms to push the button and record what you felt. Once you have worn this monitor for the period of time provider prescribed (Usually 14 days), you will return the monitor device in the postage paid box. Once it is returned they will download the data collected and provide us with a report which the provider will then review and we will call you with those results. Important tips:  1. Avoid showering during the first 24 hours of wearing the  monitor. 2. Avoid excessive sweating to help maximize wear time. 3. Do not submerge the device, no hot tubs, and no swimming pools. 4. Keep any lotions or oils away from the patch. 5. After 24 hours you may shower with the patch on. Take brief showers with your back facing the shower head.  6. Do not remove patch once it has been placed because that will interrupt data and decrease adhesive wear time. 7. Push the button when you have any symptoms and write down what you were feeling. 8. Once you have completed wearing your monitor, remove and place into box which has postage paid and place in your outgoing mailbox.  9. If for some reason you have misplaced your box then call our office and we can provide another box and/or mail it off for you.         Follow-Up: At [facility], you and your health needs are our priority.  As part of our continuing mission to provide you with exceptional heart care, we have created designated Provider Care Teams.  These Care Teams include your primary Cardiologist (physician) and Advanced Practice Providers (APPs -  Physician Assistants and Nurse Practitioners) who all work together to provide you with the care you need, when you need it.  We recommend signing up for the patient portal called "MyChart".  Sign up information is provided on this After Visit Summary.  MyChart is used to connect with patients for Virtual Visits (Telemedicine).  Patients are able to view lab/test results, encounter notes,   upcoming appointments, etc.  Non-urgent messages can be sent to your provider as well.   To learn more about what you can do with MyChart, go to [email or web address].    Your next appointment:   6 week(s)  The format for your next appointment:   In Person  Provider:   Brian Agbor-Etang, MD   Other Instructions   

## 2020-10-05 NOTE — Progress Notes (Signed)
Cardiology Office Note:    Date:  10/05/2020   ID:  [name], DOB May 26, 1986, MRN [id number]  PCP:  Zipporah Plants, CNM  [facility] Cardiologist:  No primary care provider on file.  [facility] Electrophysiologist:  None   Referring MD: Zipporah Plants, CNM   Chief Complaint  Patient presents with  . Other     Per [facility] obesity irregular heart beat. Meds reviewed verbally with patient.     History of Present Illness:    Kristen Jefferson is a 34 y.o. female with a hx of hypertension, GERD, currently pregnant who presents due to irregular heartbeat.  She states having symptoms of palpitations over the past year or so.  Symptoms typically are not related with exertion, occur about 2 times a week.  Symptoms are not related with dizziness, chest pain or shortness of breath.  She recently had a positive pregnancy test, has follow-up with OB regarding gestational age.  She also states having a history of a leaky heart valve.  She denies any history of heart disease.  Past Medical History:  Diagnosis Date  . GERD (gastroesophageal reflux disease)   . Hypertension   . Irregular heart beat   . Vitamin D deficiency     History reviewed. No pertinent surgical history.  Current Medications: Current Meds  Medication Sig  . amLODipine (NORVASC) 2.5 MG tablet Take 2.5 mg by mouth daily.  Marland Kitchen loratadine (CLARITIN) 10 MG tablet Take 10 mg by mouth daily.  . meclizine (ANTIVERT) 25 MG tablet Take 25 mg by mouth 2 (two) times daily as needed.  . metroNIDAZOLE (FLAGYL) 500 MG tablet Take 1 tablet (500 mg total) by mouth 2 (two) times daily for 7 days.  Marland Kitchen omeprazole (PRILOSEC) 40 MG capsule Take 40 mg by mouth daily.  Marland Kitchen terconazole (TERAZOL 7) 0.4 % vaginal cream Place 1 applicator vaginally at bedtime.  . Vitamin D, Ergocalciferol, (DRISDOL) 1.25 MG (50000 UNIT) CAPS capsule Take by mouth.     Allergies:   Patient has no allergy information on record.   Social History    Socioeconomic History  . Marital status: Married    Spouse name: Not on file  . Number of children: Not on file  . Years of education: Not on file  . Highest education level: Not on file  Occupational History  . Not on file  Tobacco Use  . Smoking status: Never Smoker  . Smokeless tobacco: Never Used  Vaping Use  . Vaping Use: Never used  Substance and Sexual Activity  . Alcohol use: Never  . Drug use: Never  . Sexual activity: Yes  Other Topics Concern  . Not on file  Social History Narrative  . Not on file   Social Determinants of Health   Financial Resource Strain: Not on file  Food Insecurity: Not on file  Transportation Needs: Not on file  Physical Activity: Not on file  Stress: Not on file  Social Connections: Not on file     Family History: The patient's family history includes Cancer in her maternal grandmother and mother.  ROS:   Please see the history of present illness.     All other systems reviewed and are negative.  EKGs/Labs/Other Studies Reviewed:    The following studies were reviewed today:   EKG:  EKG is  ordered today.  The ekg ordered today demonstrates normal sinus rhythm, normal ECG.  Recent Labs: 09/21/2020: ALT 9; BUN 11; Creatinine, Ser 0.58; Hemoglobin  11.8; Platelets 194; Potassium 3.8; Sodium 135  Recent Lipid Panel No results found for: CHOL, TRIG, HDL, CHOLHDL, VLDL, LDLCALC, LDLDIRECT   Risk Assessment/Calculations:      Physical Exam:    VS:  BP 120/70 (BP Location: Right Arm, Patient Position: Sitting, Cuff Size: Normal)   Pulse 100   Ht 5\' 3"  (1.6 m)   Wt 200 lb (90.7 kg)   LMP 07/03/2020   SpO2 98%   BMI 35.43 kg/m     Wt Readings from Last 3 Encounters:  10/05/20 200 lb (90.7 kg)  09/21/20 202 lb (91.6 kg)     GEN:  Well nourished, well developed in no acute distress HEENT: Normal NECK: No JVD; No carotid bruits LYMPHATICS: No lymphadenopathy CARDIAC: RRR, no murmurs, rubs, gallops RESPIRATORY:  Clear  to auscultation without rales, wheezing or rhonchi  ABDOMEN: Soft, non-tender, non-distended MUSCULOSKELETAL:  No edema; No deformity  SKIN: Warm and dry NEUROLOGIC:  Alert and oriented x 3 PSYCHIATRIC:  Normal affect   ASSESSMENT:    1. Palpitations   2. Leaky heart valve   3. Primary hypertension   4. Pregnancy, unspecified gestational age    PLAN:    In order of problems listed above:  1. Patient with palpitations, occurring 2 times a week.  Currently pregnant.  We will place a cardiac monitor to evaluate any significant arrhythmias. 2. Patient states having a history of a leaky heart valve.  Also in light of being pregnant, will get an echocardiogram to evaluate valvular function. 3. Hypertension, on amlodipine, continue. 4. Patient is currently pregnant, management as per OB GYN.  Follow-up after cardiac monitor and echo       Medication Adjustments/Labs and Tests Ordered: Current medicines are reviewed at length with the patient today.  Concerns regarding medicines are outlined above.  Orders Placed This Encounter  Procedures  . LONG TERM MONITOR (3-14 DAYS)  . EKG 12-Lead  . ECHOCARDIOGRAM COMPLETE   No orders of the defined types were placed in this encounter.   Patient Instructions  Medication Instructions:  Your physician recommends that you continue on your current medications as directed. Please refer to the Current Medication list given to you today.  *If you need a refill on your cardiac medications before your next appointment, please call your pharmacy*   Lab Work: None Ordered If you have labs (blood work) drawn today and your tests are completely normal, you will receive your results only by: 14/03/21 MyChart Message (if you have MyChart) OR . A paper copy in the mail If you have any lab test that is abnormal or we need to change your treatment, we will call you to review the results.   Testing/Procedures:  1.  Your physician has requested that you  have an echocardiogram. Echocardiography is a painless test that uses sound waves to create images of your heart. It provides your doctor with information about the size and shape of your heart and how well your heart's chambers and valves are working. This procedure takes approximately one hour. There are no restrictions for this procedure.  2.  Your physician has recommended that you wear a Zio monitor for 2 weeks. This monitor is a medical device that records the heart's electrical activity. Doctors most often use these monitors to diagnose arrhythmias. Arrhythmias are problems with the speed or rhythm of the heartbeat. The monitor is a small device applied to your chest. You can wear one while you do your normal daily activities. While  wearing this monitor if you have any symptoms to push the button and record what you felt. Once you have worn this monitor for the period of time provider prescribed (Usually 14 days), you will return the monitor device in the postage paid box. Once it is returned they will download the data collected and provide [place] with a report which the provider will then review and we will call you with those results. Important tips:  1. Avoid showering during the first 24 hours of wearing the monitor. 2. Avoid excessive sweating to help maximize wear time. 3. Do not submerge the device, no hot tubs, and no swimming pools. 4. Keep any lotions or oils away from the patch. 5. After 24 hours you may shower with the patch on. Take brief showers with your back facing the shower head.  6. Do not remove patch once it has been placed because that will interrupt data and decrease adhesive wear time. 7. Push the button when you have any symptoms and write down what you were feeling. 8. Once you have completed wearing your monitor, remove and place into box which has postage paid and place in your outgoing mailbox.  9. If for some reason you have misplaced your box then call our office and we  can provide another box and/or mail it off for you.         Follow-Up: At [facility], you and your health needs are our priority.  As part of our continuing mission to provide you with exceptional heart care, we have created designated Provider Care Teams.  These Care Teams include your primary Cardiologist (physician) and Advanced Practice Providers (APPs -  Physician Assistants and Nurse Practitioners) who all work together to provide you with the care you need, when you need it.  We recommend signing up for the patient portal called "MyChart".  Sign up information is provided on this After Visit Summary.  MyChart is used to connect with patients for Virtual Visits (Telemedicine).  Patients are able to view lab/test results, encounter notes, upcoming appointments, etc.  Non-urgent messages can be sent to your provider as well.   To learn more about what you can do with MyChart, go to [email or web address].    Your next appointment:   6 week(s)  The format for your next appointment:   In Person  Provider:   Debbe Odea, MD   Other Instructions      Signed, Debbe Odea, MD  10/05/2020 4:37 PM    [facility]

## 2020-10-08 ENCOUNTER — Other Ambulatory Visit: Payer: Self-pay | Admitting: Obstetrics and Gynecology

## 2020-10-08 ENCOUNTER — Other Ambulatory Visit: Payer: Self-pay

## 2020-10-08 ENCOUNTER — Other Ambulatory Visit: Payer: Medicaid Other

## 2020-10-08 ENCOUNTER — Encounter: Payer: Self-pay | Admitting: Obstetrics and Gynecology

## 2020-10-08 ENCOUNTER — Ambulatory Visit (INDEPENDENT_AMBULATORY_CARE_PROVIDER_SITE_OTHER): Payer: Medicaid Other | Admitting: Obstetrics and Gynecology

## 2020-10-08 ENCOUNTER — Ambulatory Visit (INDEPENDENT_AMBULATORY_CARE_PROVIDER_SITE_OTHER): Payer: Medicaid Other

## 2020-10-08 VITALS — BP 120/82 | Wt 203.0 lb

## 2020-10-08 DIAGNOSIS — N926 Irregular menstruation, unspecified: Secondary | ICD-10-CM

## 2020-10-08 DIAGNOSIS — Z3689 Encounter for other specified antenatal screening: Secondary | ICD-10-CM | POA: Diagnosis not present

## 2020-10-08 DIAGNOSIS — O10019 Pre-existing essential hypertension complicating pregnancy, unspecified trimester: Secondary | ICD-10-CM

## 2020-10-08 DIAGNOSIS — Z369 Encounter for antenatal screening, unspecified: Secondary | ICD-10-CM

## 2020-10-08 DIAGNOSIS — E669 Obesity, unspecified: Secondary | ICD-10-CM

## 2020-10-08 DIAGNOSIS — O0930 Supervision of pregnancy with insufficient antenatal care, unspecified trimester: Secondary | ICD-10-CM

## 2020-10-08 DIAGNOSIS — Z3A24 24 weeks gestation of pregnancy: Secondary | ICD-10-CM

## 2020-10-08 DIAGNOSIS — I10 Essential (primary) hypertension: Secondary | ICD-10-CM

## 2020-10-08 DIAGNOSIS — Z3A13 13 weeks gestation of pregnancy: Secondary | ICD-10-CM

## 2020-10-08 DIAGNOSIS — O09522 Supervision of elderly multigravida, second trimester: Secondary | ICD-10-CM

## 2020-10-08 DIAGNOSIS — Z1379 Encounter for other screening for genetic and chromosomal anomalies: Secondary | ICD-10-CM

## 2020-10-08 DIAGNOSIS — O09519 Supervision of elderly primigravida, unspecified trimester: Secondary | ICD-10-CM | POA: Insufficient documentation

## 2020-10-08 DIAGNOSIS — O0990 Supervision of high risk pregnancy, unspecified, unspecified trimester: Secondary | ICD-10-CM

## 2020-10-08 DIAGNOSIS — Z31438 Encounter for other genetic testing of female for procreative management: Secondary | ICD-10-CM

## 2020-10-08 MED ORDER — ASPIRIN EC 81 MG PO TBEC: 81.0000 mg | DELAYED_RELEASE_TABLET | Freq: Every day | ORAL | 2 refills | Status: DC

## 2020-10-08 MED ORDER — CONCEPT OB 130-92.4-1 MG PO CAPS: 1.0000 | ORAL_CAPSULE | Freq: Every day | ORAL | 11 refills | Status: DC

## 2020-10-08 NOTE — Progress Notes (Signed)
Routine Prenatal Care Visit  Subjective  Kristen Jefferson is a 34 y.o. G1P0 at [redacted]w[redacted]d being seen today for ongoing prenatal care.  She is currently monitored for the following issues for this high-risk pregnancy and has Irregular periods; History of irregular heartbeat; Supervision of high risk pregnancy, antepartum; Obesity (BMI 30-39.9); Advanced maternal age, primigravida; Benign essential hypertension, antepartum; and Late prenatal care on their problem list.  ----------------------------------------------------------------------------------- Patient reports no complaints.   Contractions: Not present. Vag. Bleeding: None.  Movement: Present. Denies leaking of fluid.  ----------------------------------------------------------------------------------- The following portions of the patient's history were reviewed and updated as appropriate: allergies, current medications, past family history, past medical history, past social history, past surgical history and problem list. Problem list updated.   Objective  Blood pressure 120/82, weight 203 lb (92.1 kg), last menstrual period 07/03/2020. Pregravid weight 196 lb (88.9 kg) Total Weight Gain 7 lb (3.175 kg) Urinalysis:      Fetal Status: Fetal Heart Rate (bpm): 155   Movement: Present     General:  Alert, oriented and cooperative. Patient is in no acute distress.  Skin: Skin is warm and dry. No rash noted.   Cardiovascular: Normal heart rate noted  Respiratory: Normal respiratory effort, no problems with respiration noted  Abdomen: Soft, gravid, appropriate for gestational age. Pain/Pressure: Absent     Pelvic:  Cervical exam deferred        Extremities: Normal range of motion.     ental Status: Normal mood and affect. Normal behavior. Normal judgment and thought content.     Assessment   34 y.o. G1P0 at [redacted]w[redacted]d by  04/09/2021, by Last Menstrual Period presenting for routine prenatal visit  Plan   pregnancy1 Problems (from  07/03/20 to present)    Problem Noted Resolved   Irregular periods 09/21/2020 by Zipporah Plants, CNM No   Chronic hypertension 09/21/2020 by Zipporah Plants, CNM No   Overview Signed 09/21/2020  9:13 AM by Zipporah Plants, CNM    [ ]  Aspirin 81 mg daily after 12 weeks; discontinue after 36 weeks [ ]  baseline labs with CBC, CMP, urine protein/creatinine ratio [ ]  no BP meds unless BPs become elevated [ ]  ultrasound for growth at 28, 32, 36 weeks [ ]  Aspirin 81 mg daily after 12 weeks; discontinue after 36 weeks [ ]  Baseline EKG   Current antihypertensives:  amlodipine  Baseline and surveillance labs (pulled in from EPIC, refresh links as needed)  No results found for: PLT, CREATININE, AST, ALT, PROTCRRATIO, LABPROT, PROTEIN24HR  Antenatal Testing CHTN - O10.919  Group I  BP < 140/90, no preeclampsia, AGA,  nml AFV, +/- meds    Group II BP > 140/90, on meds, no preeclampsia, AGA, nml AFV  20-28-34-38  20-24-28-32-35-38  32//2 x wk  28//BPP wkly then 32//2 x wk  40 no meds; 39 meds  PRN or 37  Pre-eclampsia  GHTN - O13.9/Preeclampsia without severe features  - O14.00   Preeclampsia with severe features - O14.10  Q 3-4wks  Q 2 wks  28//BPP wkly then 32//2 x wk  Inpatient  37  PRN or 34         History of irregular heartbeat 09/21/2020 by , CNM No   Supervision of high risk pregnancy, antepartum 09/21/2020 by 01-17-2005, CNM No   Overview Signed 09/21/2020  9:21 AM by 07-18-1989, CNM    [facility] Prenatal Labs  Dating  Blood type:     Genetic Screen 1 Screen:  AFP:     Quad:     NIPS: Antibody:   Anatomic [place]  Rubella:   Varicella: @VZVIGG @  GTT Early:               Third trimester:  RPR:     Rhogam  HBsAg:     TDaP vaccine                       Flu Shot: HIV:     Baby Food                                GBS:   Contraception  Pap:  CBB     CS/VBAC    Support Person  Robert          Obesity (BMI 30-39.9) 09/21/2020 by 14/12/2019, CNM No    Overview Addendum 09/21/2020  9:20 AM by 14/12/2019, CNM    BMI >=35.0-39.9 [ ]  early 1h gtt - ordered  [ ]  u/s for dating [ ]  - ordered [x ] nutritional goals - reviewed recommendations [ ]  folic acid 1mg  [ ]  bASA (>12 weeks) [ ]  consider nutrition consult [ ]  consider maternal EKG 1st trimester - ordered 12/3 [ ]  Growth u/s 28 [ ] , 32 [ ] , 36 weeks [ ]  [ ]  NST/AFI weekly 37+ weeks (37[] , 38[] , 39[] , 40[] ) [ ]  IOL by 41 weeks (scheduled, prn [] )       Previous Version       Gestational age appropriate obstetric precautions including but not limited to vaginal bleeding, contractions, leaking of fluid and fetal movement were reviewed in detail with the patient.    - given doing early 1-hr done today with MaternT21 and Inheritest will check 28 week labs at 30 weeks as opposed to 28  - currently being worked up by cardiology for holter monitor  Return in about 4 weeks (around 11/05/2020) for 4 week ROB, 6 week ROB and 28 week labs.  , MD, [facility], [facility] 10/08/2020, 12:16 PM

## 2020-10-09 ENCOUNTER — Telehealth: Payer: Self-pay

## 2020-10-09 ENCOUNTER — Telehealth: Payer: Self-pay | Admitting: Obstetrics and Gynecology

## 2020-10-09 LAB — GLUCOSE, 1 HOUR GESTATIONAL: Gestational Diabetes Screen: 158 mg/dL — ABNORMAL HIGH (ref 65–139)

## 2020-10-09 NOTE — Telephone Encounter (Signed)
[facility] calling about specimen sent yesterday. She has 2 specimens in the bag. One for this pt and one for [facility]. They are needing req for [facility] specimen faxed to [phone].

## 2020-10-09 NOTE — Telephone Encounter (Signed)
Patient is calling for labs results. Please advise. [phone]

## 2020-10-09 NOTE — Progress Notes (Signed)
Could you call and let her know she needs a three hour?

## 2020-10-09 NOTE — Telephone Encounter (Signed)
Sending back to you.

## 2020-10-10 ENCOUNTER — Other Ambulatory Visit: Payer: Self-pay | Admitting: Obstetrics and Gynecology

## 2020-10-10 ENCOUNTER — Telehealth: Payer: Self-pay

## 2020-10-10 DIAGNOSIS — O0990 Supervision of high risk pregnancy, unspecified, unspecified trimester: Secondary | ICD-10-CM

## 2020-10-10 DIAGNOSIS — R7309 Other abnormal glucose: Secondary | ICD-10-CM

## 2020-10-10 DIAGNOSIS — O99810 Abnormal glucose complicating pregnancy: Secondary | ICD-10-CM | POA: Insufficient documentation

## 2020-10-10 NOTE — Telephone Encounter (Signed)
Patient is scheduled for 11/02/20 for 3 gtt labs. I offered first available appointment 10/23/20. Patient requested for Friday only appointment. First available is on Friday, 11/02/20

## 2020-10-10 NOTE — Telephone Encounter (Signed)
Pt scheduled  

## 2020-10-10 NOTE — Telephone Encounter (Signed)
Patient called, orders for 3-hr glucose tolerance placed

## 2020-10-16 LAB — MATERNIT 21 PLUS CORE, BLOOD
Fetal Fraction: 9
Result (T21): NEGATIVE
Trisomy 13 (Patau syndrome): NEGATIVE
Trisomy 18 (Edwards syndrome): NEGATIVE
Trisomy 21 (Down syndrome): NEGATIVE

## 2020-10-19 LAB — INHERITEST CORE(CF97,SMA,FRAX)

## 2020-10-29 ENCOUNTER — Telehealth: Payer: Self-pay | Admitting: *Deleted

## 2020-10-29 NOTE — Telephone Encounter (Signed)
-----   Message from Debbe Odea, MD sent at 10/29/2020  1:22 PM EST ----- Benign cardiac monitor, no significant arrhythmias.

## 2020-10-29 NOTE — Telephone Encounter (Signed)
No answer. Left message to call back.   

## 2020-10-30 NOTE — Telephone Encounter (Signed)
Spoke with patients husband and gave him the result note from the [place] monitor per DPR on file.

## 2020-11-02 ENCOUNTER — Other Ambulatory Visit: Payer: Medicaid Other

## 2020-11-02 ENCOUNTER — Other Ambulatory Visit: Payer: Self-pay

## 2020-11-02 DIAGNOSIS — O99810 Abnormal glucose complicating pregnancy: Secondary | ICD-10-CM

## 2020-11-03 LAB — GESTATIONAL GLUCOSE TOLERANCE
Glucose, Fasting: 113 mg/dL — ABNORMAL HIGH (ref 65–94)
Glucose, GTT - 1 Hour: 225 mg/dL — ABNORMAL HIGH (ref 65–179)
Glucose, GTT - 2 Hour: 241 mg/dL — ABNORMAL HIGH (ref 65–154)
Glucose, GTT - 3 Hour: 201 mg/dL — ABNORMAL HIGH (ref 65–139)

## 2020-11-06 ENCOUNTER — Other Ambulatory Visit: Payer: Self-pay | Admitting: Obstetrics and Gynecology

## 2020-11-06 DIAGNOSIS — O24419 Gestational diabetes mellitus in pregnancy, unspecified control: Secondary | ICD-10-CM

## 2020-11-06 MED ORDER — ACCU-CHEK AVIVA PLUS W/DEVICE KIT: PACK | 0 refills | Status: DC

## 2020-11-06 MED ORDER — ACCU-CHEK SOFTCLIX LANCETS MISC: 1.0000 | Freq: Four times a day (QID) | 12 refills | Status: DC

## 2020-11-06 MED ORDER — GLUCOSE BLOOD VI STRP: ORAL_STRIP | 12 refills | Status: DC

## 2020-11-06 NOTE — Progress Notes (Signed)
Informed patient of 3-hr results.  Testing supplies called in pharmacy confirmed.  [facility] referral sent.

## 2020-11-09 ENCOUNTER — Encounter: Payer: Self-pay | Admitting: Obstetrics and Gynecology

## 2020-11-09 ENCOUNTER — Other Ambulatory Visit (HOSPITAL_COMMUNITY)
Admission: RE | Admit: 2020-11-09 | Discharge: 2020-11-09 | Disposition: A | Discharge status: Home / Self Care | Payer: Medicaid Other | Source: Ambulatory Visit | Attending: Obstetrics and Gynecology | Admitting: Obstetrics and Gynecology

## 2020-11-09 ENCOUNTER — Ambulatory Visit (INDEPENDENT_AMBULATORY_CARE_PROVIDER_SITE_OTHER): Payer: Medicaid Other | Admitting: Obstetrics and Gynecology

## 2020-11-09 ENCOUNTER — Other Ambulatory Visit: Payer: Self-pay

## 2020-11-09 ENCOUNTER — Ambulatory Visit (INDEPENDENT_AMBULATORY_CARE_PROVIDER_SITE_OTHER): Payer: Medicaid Other

## 2020-11-09 VITALS — BP 126/84 | Wt 204.0 lb

## 2020-11-09 DIAGNOSIS — E669 Obesity, unspecified: Secondary | ICD-10-CM

## 2020-11-09 DIAGNOSIS — A599 Trichomoniasis, unspecified: Secondary | ICD-10-CM | POA: Diagnosis present

## 2020-11-09 DIAGNOSIS — Z3A25 25 weeks gestation of pregnancy: Secondary | ICD-10-CM | POA: Insufficient documentation

## 2020-11-09 DIAGNOSIS — N926 Irregular menstruation, unspecified: Secondary | ICD-10-CM

## 2020-11-09 DIAGNOSIS — I38 Endocarditis, valve unspecified: Secondary | ICD-10-CM

## 2020-11-09 DIAGNOSIS — O24410 Gestational diabetes mellitus in pregnancy, diet controlled: Secondary | ICD-10-CM

## 2020-11-09 DIAGNOSIS — O0990 Supervision of high risk pregnancy, unspecified, unspecified trimester: Secondary | ICD-10-CM | POA: Diagnosis present

## 2020-11-09 DIAGNOSIS — O10019 Pre-existing essential hypertension complicating pregnancy, unspecified trimester: Secondary | ICD-10-CM

## 2020-11-09 DIAGNOSIS — Z113 Encounter for screening for infections with a predominantly sexual mode of transmission: Secondary | ICD-10-CM | POA: Diagnosis present

## 2020-11-09 DIAGNOSIS — O09519 Supervision of elderly primigravida, unspecified trimester: Secondary | ICD-10-CM

## 2020-11-09 DIAGNOSIS — Z8679 Personal history of other diseases of the circulatory system: Secondary | ICD-10-CM

## 2020-11-09 LAB — ECHOCARDIOGRAM COMPLETE
AR max vel: 2.12 cm2
AV Area VTI: 2.09 cm2
AV Area mean vel: 2.06 cm2
AV Mean grad: 5 mmHg
AV Peak grad: 9.1 mmHg
Ao pk vel: 1.51 m/s
Area-P 1/2: 5.75 cm2
Calc EF: 56.3 %
S' Lateral: 3.2 cm
Single Plane A2C EF: 56.2 %
Single Plane A4C EF: 57.2 %
Weight: 3264 oz

## 2020-11-09 MED ORDER — GLUCOSE BLOOD VI STRP: ORAL_STRIP | 12 refills | Status: DC

## 2020-11-09 MED ORDER — CONCEPT OB 130-92.4-1 MG PO CAPS
1.0000 | ORAL_CAPSULE | Freq: Every day | ORAL | 11 refills | Status: DC
Start: 2020-11-09 — End: 2022-12-12

## 2020-11-09 MED ORDER — ACCU-CHEK AVIVA PLUS W/DEVICE KIT
PACK | 0 refills | Status: DC
Start: 2020-11-09 — End: 2020-11-16

## 2020-11-09 MED ORDER — ACCU-CHEK SOFTCLIX LANCETS MISC: 1.0000 | Freq: Four times a day (QID) | 12 refills | Status: DC

## 2020-11-09 NOTE — Progress Notes (Signed)
Routine Prenatal Care Visit  Subjective  Kristen Jefferson is a 35 y.o. G1P0 at [redacted]w[redacted]d being seen today for ongoing prenatal care.  She is currently monitored for the following issues for this high-risk pregnancy and has Irregular periods; History of irregular heartbeat; Supervision of high risk pregnancy, antepartum; Obesity (BMI 30-39.9); Advanced maternal age, primigravida; Benign essential hypertension, antepartum; Late prenatal care; Abnormal glucose tolerance affecting pregnancy, antepartum; and Gestational diabetes mellitus (GDM), antepartum on their problem list.  ----------------------------------------------------------------------------------- Patient reports no complaints.   Contractions: Not present. Vag. Bleeding: None.  Movement: Present. Leaking Fluid denies.  Has not gotten her meter yet. [facility] appt on 1/28.  COntinuing to follow up on cardiac w/u. ECHO today.  ----------------------------------------------------------------------------------- The following portions of the patient's history were reviewed and updated as appropriate: allergies, current medications, past family history, past medical history, past social history, past surgical history and problem list. Problem list updated.  Objective  Blood pressure 126/84, weight 204 lb (92.5 kg), last menstrual period 07/03/2020, unknown if currently breastfeeding. Pregravid weight 196 lb (88.9 kg) Total Weight Gain 8 lb (3.629 kg) Urinalysis: Urine Protein    Urine Glucose    Fetal Status: Fetal Heart Rate (bpm): 140 Fundal Height: 26 cm Movement: Present     General:  Alert, oriented and cooperative. Patient is in no acute distress.  Skin: Skin is warm and dry. No rash noted.   Cardiovascular: Normal heart rate noted  Respiratory: Normal respiratory effort, no problems with respiration noted  Abdomen: Soft, gravid, appropriate for gestational age. Pain/Pressure: Absent     Pelvic:  Cervical exam deferred         Extremities: Normal range of motion.     Mental Status: Normal mood and affect. Normal behavior. Normal judgment and thought content.   Assessment   35 y.o. G1P0 at 103w5d by  02/17/2021, by Ultrasound presenting for routine prenatal visit  Plan   pregnancy1 Problems (from 07/03/20 to present)    Problem Noted Resolved   Advanced maternal age, primigravida 10/08/2020 by Vena Austria, MD No   Benign essential hypertension, antepartum 10/08/2020 by Vena Austria, MD No   Overview Addendum 10/08/2020 12:11 PM by Vena Austria, MD    [X]  Aspirin 81 mg daily after 12 weeks; discontinue after 36 weeks [X]  baseline labs with CBC, CMP, urine protein/creatinine ratio [ ]  no BP meds unless BPs become elevated [ ]  ultrasound for growth at 28, 32, 36 weeks [ ]  Baseline EKG   Current antihypertensives:  amlodipine  Baseline and surveillance labs (pulled in from EPIC, refresh links as needed)  No results found for: PLT, CREATININE, AST, ALT, PROTCRRATIO, LABPROT, PROTEIN24HR  Antenatal Testing CHTN - O10.919  Group I  BP < 140/90, no preeclampsia, AGA,  nml AFV, +/- meds    Group II BP > 140/90, on meds, no preeclampsia, AGA, nml AFV  20-28-34-38  20-24-28-32-35-38  32//2 x wk  28//BPP wkly then 32//2 x wk  40 no meds; 39 meds  PRN or 37  Pre-eclampsia  GHTN - O13.9/Preeclampsia without severe features  - O14.00   Preeclampsia with severe features - O14.10  Q 3-4wks  Q 2 wks  28//BPP wkly then 32//2 x wk  Inpatient  37  PRN or 34         Previous Version   Irregular periods 09/21/2020 by , CNM No   History of irregular heartbeat 09/21/2020 by [birth year]1971, CNM No   Supervision of high risk pregnancy, antepartum 09/21/2020 by  Zipporah Plants, CNM No   Overview Addendum 10/30/2020 10:30 AM by Vena Austria, MD    [facility]inic [facility] Prenatal Labs  Dating 24 week [place] Blood type: A pos  Genetic Screen NIPS: Normal XY Inheritest: negative CF, Fragile-X,  and SMA Antibody: negative  Anatomic [place] Normal 10/08/2020 Rubella: Non-Immune Varicella: Immune  GTT Early 156 RPR: NR  Rhogam N/A HBsAg: negative  TDaP vaccine                       Flu Shot: HIV: negative  Baby Food                                GBS:   Contraception  Pap: 09/21/2020 ASCUS HPV negative  CBB   Trichomonas + NOB  CS/VBAC    Support Person  Robert   HTN - amlodipine       Previous Version   Obesity (BMI 30-39.9) 09/21/2020 by Zipporah Plants, CNM No   Overview Addendum 09/21/2020  9:20 AM by Zipporah Plants, CNM    BMI >=35.0-39.9 [ ]  early 1h gtt - ordered  [ ]  u/s for dating [ ]  - ordered [x ] nutritional goals - reviewed recommendations [ ]  folic acid 1mg  [ ]  bASA (>12 weeks) [ ]  consider nutrition consult [ ]  consider maternal EKG 1st trimester - ordered 12/3 [ ]  Growth u/s 28 [ ] , 32 [ ] , 36 weeks [ ]  [ ]  NST/AFI weekly 37+ weeks (37[] , 38[] , 39[] , 40[] ) [ ]  IOL by 41 weeks (scheduled, prn [] )       Previous Version   Chronic hypertension 09/21/2020 by , CNM 10/08/2020 by , MD   Overview Signed 09/21/2020  9:13 AM by , CNM    [ ]  Aspirin 81 mg daily after 12 weeks; discontinue after 36 weeks [ ]  baseline labs with CBC, CMP, urine protein/creatinine ratio [ ]  no BP meds unless BPs become elevated [ ]  ultrasound for growth at 28, 32, 36 weeks [ ]  Aspirin 81 mg daily after 12 weeks; discontinue after 36 weeks [ ]  Baseline EKG   Current antihypertensives:  amlodipine  Baseline and surveillance labs (pulled in from EPIC, refresh links as needed)  No results found for: PLT, CREATININE, AST, ALT, PROTCRRATIO, LABPROT, PROTEIN24HR  Antenatal Testing CHTN - O10.919  Group I  BP < 140/90, no preeclampsia, AGA,  nml AFV, +/- meds    Group II BP > 140/90, on meds, no preeclampsia, AGA, nml AFV  20-28-34-38  20-24-28-32-35-38  32//2 x wk  28//BPP wkly then 32//2 x wk  40 no meds; 39 meds  PRN or 37  Pre-eclampsia   GHTN - O13.9/Preeclampsia without severe features  - O14.00   Preeclampsia with severe features - O14.10  Q 3-4wks  Q 2 wks  28//BPP wkly then 32//2 x wk  Inpatient  37  PRN or 34             Preterm labor symptoms and general obstetric precautions including but not limited to vaginal bleeding, contractions, leaking of fluid and fetal movement were reviewed in detail with the patient. Please refer to After Visit Summary for other counseling recommendations.   Return in about 2 weeks (around 11/23/2020) for add u/s for growth to appt already scheduled for 2/4.   , MD, OB/GYN, [facility] 11/09/2020 11:23 AM

## 2020-11-12 LAB — CERVICOVAGINAL ANCILLARY ONLY
Comment: NEGATIVE
Trichomonas: NEGATIVE

## 2020-11-16 ENCOUNTER — Encounter: Payer: Medicaid Other | Attending: Obstetrics and Gynecology | Admitting: *Deleted

## 2020-11-16 ENCOUNTER — Encounter: Payer: Self-pay | Admitting: *Deleted

## 2020-11-16 ENCOUNTER — Other Ambulatory Visit: Payer: Self-pay | Admitting: Obstetrics

## 2020-11-16 ENCOUNTER — Ambulatory Visit (INDEPENDENT_AMBULATORY_CARE_PROVIDER_SITE_OTHER): Payer: Medicaid Other | Admitting: Cardiology

## 2020-11-16 ENCOUNTER — Encounter: Payer: Self-pay | Admitting: Cardiology

## 2020-11-16 ENCOUNTER — Other Ambulatory Visit: Payer: Self-pay

## 2020-11-16 VITALS — BP 100/74 | Ht 63.0 in | Wt 204.1 lb

## 2020-11-16 VITALS — BP 110/76 | HR 118 | Ht 63.0 in | Wt 206.0 lb

## 2020-11-16 DIAGNOSIS — O24419 Gestational diabetes mellitus in pregnancy, unspecified control: Secondary | ICD-10-CM

## 2020-11-16 DIAGNOSIS — O24410 Gestational diabetes mellitus in pregnancy, diet controlled: Secondary | ICD-10-CM

## 2020-11-16 DIAGNOSIS — I10 Essential (primary) hypertension: Secondary | ICD-10-CM

## 2020-11-16 DIAGNOSIS — Z3490 Encounter for supervision of normal pregnancy, unspecified, unspecified trimester: Secondary | ICD-10-CM

## 2020-11-16 DIAGNOSIS — I38 Endocarditis, valve unspecified: Secondary | ICD-10-CM | POA: Diagnosis not present

## 2020-11-16 DIAGNOSIS — O0990 Supervision of high risk pregnancy, unspecified, unspecified trimester: Secondary | ICD-10-CM

## 2020-11-16 DIAGNOSIS — R002 Palpitations: Secondary | ICD-10-CM | POA: Diagnosis not present

## 2020-11-16 MED ORDER — BLOOD GLUCOSE MONITORING SUPPL MISC: 1 refills | Status: DC

## 2020-11-16 NOTE — Patient Instructions (Signed)
Medication Instructions:  Your physician has recommended you make the following change in your medication:   STOP taking your Norvasc (amLODipine).  *If you need a refill on your cardiac medications before your next appointment, please call your pharmacy*   Lab Work: None Ordered If you have labs (blood work) drawn today and your tests are completely normal, you will receive your results only by: Marland Kitchen MyChart Message (if you have MyChart) OR . A paper copy in the mail If you have any lab test that is abnormal or we need to change your treatment, we will call you to review the results.   Testing/Procedures: None Ordered   Follow-Up: At [facility], you and your health needs are our priority.  As part of our continuing mission to provide you with exceptional heart care, we have created designated Provider Care Teams.  These Care Teams include your primary Cardiologist (physician) and Advanced Practice Providers (APPs -  Physician Assistants and Nurse Practitioners) who all work together to provide you with the care you need, when you need it.  We recommend signing up for the patient portal called "MyChart".  Sign up information is provided on this After Visit Summary.  MyChart is used to connect with patients for Virtual Visits (Telemedicine).  Patients are able to view lab/test results, encounter notes, upcoming appointments, etc.  Non-urgent messages can be sent to your provider as well.   To learn more about what you can do with MyChart, go to [email or web address].    Your next appointment:   4-6 week(s)  The format for your next appointment:   In Person  Provider:   Debbe Odea, MD   Other Instructions

## 2020-11-16 NOTE — Patient Instructions (Addendum)
Read booklet on Gestational Diabetes Follow Gestational Meal Planning Guidelines Limit desserts and sweets, foods high in fat (gravy, chips) Drink more water (6-8 servings/day) Avoid sugar sweetened drinks (soda, juice) Avoid cold cereal for breakfast  Complete a 3 Day Food Record and bring to next appointment Check blood sugars 4 x day - before breakfast and 2 hrs after every meal and record  Bring blood sugar log to all appointments Call MD for prescription for meter strips and lancets Strips Accu-Chek Guide Lancets   Accu-Chek Softclix Purchase urine ketone strips if instructed by MD and check urine ketones every am:  If + increase bedtime snack to 1 protein and 2 carbohydrate servings Walk 20-30 minutes at least 5 x week if permitted by MD

## 2020-11-16 NOTE — Progress Notes (Signed)
Diabetes Self-Management Education  Visit Type: First/Initial  Appt. Start Time: 1000 Appt. End Time: 1130  11/16/2020  Ms. Kristen Jefferson, identified by name and date of birth, is a 35 y.o. female with a diagnosis of Diabetes: Gestational Diabetes.   ASSESSMENT  Blood pressure 100/74, height 5\' 3"  (1.6 m), weight 204 lb 1.6 oz (92.6 kg), last menstrual period 07/03/2020, estimated date of delivery 02/17/2021 Body mass index is 36.15 kg/m.   Diabetes Self-Management Education - 11/16/20 1204      Visit Information   Visit Type First/Initial      Initial Visit   Diabetes Type Gestational Diabetes    Are you currently following a meal plan? No    Are you taking your medications as prescribed? Yes    Date Diagnosed 1 week      Health Coping   How would you rate your overall health? Good      Psychosocial Assessment   Patient Belief/Attitude about Diabetes Other (comment)   "none"   Self-care barriers None    Self-management support Doctor's office;Family    Patient Concerns Nutrition/Meal planning;Weight Control    Special Needs None    Preferred Learning Style Hands on    Learning Readiness Ready    How often do you need to have someone help you when you read instructions, pamphlets, or other written materials from your doctor or pharmacy? 1 - Never    What is the last grade level you completed in school? 12th grade      Pre-Education Assessment   Patient understands the diabetes disease and treatment process. Needs Instruction    Patient understands incorporating nutritional management into lifestyle. Needs Instruction    Patient undertands incorporating physical activity into lifestyle. Needs Instruction    Patient understands using medications safely. Needs Instruction    Patient understands monitoring blood glucose, interpreting and using results Needs Instruction    Patient understands prevention, detection, and treatment of acute complications. Needs Instruction     Patient understands prevention, detection, and treatment of chronic complications. Needs Instruction    Patient understands how to develop strategies to address psychosocial issues. Needs Instruction    Patient understands how to develop strategies to promote health/change behavior. Needs Instruction      Complications   How often do you check your blood sugar? 0 times/day (not testing)   Provided Accu-Chek Guide Me meter and instructed on use. BG upon return demonstration was 140 mg/dL at 11/18/20 am - 2 hrs pp. Pt reports eating 2 biscuits for breakfast (gravy and sausage).   Have you had a dilated eye exam in the past 12 months? No    Have you had a dental exam in the past 12 months? Yes    Are you checking your feet? No      Dietary Intake   Breakfast cereal and milk    Snack (morning) 1-2 snacks/day - chips, snack cakes    Lunch meat sandwich with chips, fruit (fruit cups, mandarin oranges, banana, apple), salad with pettuce tomatoes, cheese, bacon bits, croutons    Dinner beef, chicken, pork; potatoes, peas, beans, corn, pasta, green beans, carrots    Beverage(s) soda, juice      Exercise   Exercise Type ADL's      Patient Education   Previous Diabetes Education No    Disease state  Definition of diabetes, type 1 and 2, and the diagnosis of diabetes;Factors that contribute to the development of diabetes    Nutrition management  Role of diet in the treatment of diabetes and the relationship between the three main macronutrients and blood glucose level;Food label reading, portion sizes and measuring food.;Reviewed blood glucose goals for pre and post meals and how to evaluate the patients' food intake on their blood glucose level.    Physical activity and exercise  Role of exercise on diabetes management, blood pressure control and cardiac health.    Medications Other (comment)   Limited use or oral medications during pregnancy and potential for insulin.   Monitoring Taught/evaluated SMBG  meter.;Purpose and frequency of SMBG.;Taught/discussed recording of test results and interpretation of SMBG.;Identified appropriate SMBG and/or A1C goals.;Ketone testing, when, how.    Chronic complications Relationship between chronic complications and blood glucose control    Psychosocial adjustment Identified and addressed patients feelings and concerns about diabetes    Preconception care Pregnancy and GDM  Role of pre-pregnancy blood glucose control on the development of the fetus;Reviewed with patient blood glucose goals with pregnancy;Role of family planning for patients with diabetes      Individualized Goals (developed by patient)   Reducing Risk Other (comment)   lose weight     Outcomes   Expected Outcomes Demonstrated interest in learning. Expect positive outcomes    Future DMSE 2 wks           Individualized Plan for Diabetes Self-Management Training:   Learning Objective:  Patient will have a greater understanding of diabetes self-management. Patient education plan is to attend individual and/or group sessions per assessed needs and concerns.   Plan:   Patient Instructions  Read booklet on Gestational Diabetes Follow Gestational Meal Planning Guidelines Limit desserts and sweets, foods high in fat (gravy, chips) Drink more water (6-8 servings/day) Avoid sugar sweetened drinks (soda, juice) Avoid cold cereal for breakfast  Complete a 3 Day Food Record and bring to next appointment Check blood sugars 4 x day - before breakfast and 2 hrs after every meal and record  Bring blood sugar log to all appointments Call MD for prescription for meter strips and lancets Strips Accu-Chek Guide Lancets   Accu-Chek Softclix Purchase urine ketone strips if instructed by MD and check urine ketones every am:  If + increase bedtime snack to 1 protein and 2 carbohydrate servings Walk 20-30 minutes at least 5 x week if permitted by MD  Expected Outcomes:  Demonstrated interest in  learning. Expect positive outcomes  Education material provided:  Gestational Booklet Gestational Meal Planning Guidelines Simple Meal Plan Viewed Gestational Diabetes Video Meter = Accu-Chek Guide Me 3 Day Food Record Goals for a Healthy Pregnancy  If problems or questions, patient to contact team via:  Sharion Settler, RN, CCM, CDCES [phone]  Future DSME appointment: 2 wks  November 30, 2020 with the dietitian

## 2020-11-16 NOTE — Progress Notes (Signed)
Cardiology Office Note:    Date:  11/16/2020   ID:  Kristen Jefferson, DOB June 14, 1986, MRN [id number]  PCP:  Zipporah Plants, CNM  [facility] Cardiologist:  No primary care provider on file.  [facility] Electrophysiologist:  None   Referring MD: Zipporah Plants, CNM   Chief Complaint  Patient presents with  . Follow-up    6 week F/U after echo    History of Present Illness:    Kristen Jefferson is a 35 y.o. female with a hx of hypertension, GERD, currently [redacted] weeks pregnant who presents for follow-up.  Last seen due to irregular heartbeat and being told she has a leaky valve in the past.  Echocardiogram, cardiac monitor was placed to evaluate patient's symptoms and also presence of valvular heart disease.  Presents today for follow-up, no new concerns.   Past Medical History:  Diagnosis Date  . GERD (gastroesophageal reflux disease)   . Gestational diabetes   . Hypertension   . Irregular heart beat   . Vitamin D deficiency     History reviewed. No pertinent surgical history.  Current Medications: Current Meds  Medication Sig  . Accu-Chek Softclix Lancets lancets 1 each by Other route 4 (four) times daily.  Marland Kitchen aspirin EC 81 MG tablet Take 1 tablet (81 mg total) by mouth daily. Take after 12 weeks for prevention of preeclampssia later in pregnancy  . Blood Glucose Monitoring Suppl MISC Accucheck Guide me strips please.Patient has the monitor.  Marland Kitchen glucose blood test strip UCheck blood glucose four times daily as directed (fasting, and 2-hr post breakfast, lunch, dinner)  . loratadine (CLARITIN) 10 MG tablet Take 10 mg by mouth daily as needed.  . meclizine (ANTIVERT) 25 MG tablet Take 25 mg by mouth 2 (two) times daily as needed.  Marland Kitchen omeprazole (PRILOSEC) 40 MG capsule Take 40 mg by mouth daily.  Burnis Medin w/o A Vit-FeFum-FePo-FA (CONCEPT OB) 130-92.4-1 MG CAPS Take 1 capsule by mouth daily.  . [DISCONTINUED] amLODipine (NORVASC) 2.5 MG tablet Take 2.5 mg by mouth daily.      Allergies:   Patient has no known allergies.   Social History   Socioeconomic History  . Marital status: Married    Spouse name: Not on file  . Number of children: Not on file  . Years of education: Not on file  . Highest education level: Not on file  Occupational History  . Not on file  Tobacco Use  . Smoking status: Former Smoker    Packs/day: 0.75    Years: 1.00    Pack years: 0.75    Quit date: 10/20/2013    Years since quitting: 7.0  . Smokeless tobacco: Never Used  Vaping Use  . Vaping Use: Never used  Substance and Sexual Activity  . Alcohol use: Never  . Drug use: Never  . Sexual activity: Yes  Other Topics Concern  . Not on file  Social History Narrative  . Not on file   Social Determinants of Health   Financial Resource Strain: Not on file  Food Insecurity: Not on file  Transportation Needs: Not on file  Physical Activity: Not on file  Stress: Not on file  Social Connections: Not on file     Family History: The patient's family history includes Cancer in her maternal grandmother and mother.  ROS:   Please see the history of present illness.     All other systems reviewed and are negative.  EKGs/Labs/Other Studies Reviewed:    The  following studies were reviewed today:   EKG:  EKG is  ordered today.  The ekg ordered today demonstrates normal sinus rhythm, normal ECG.  Recent Labs: 09/21/2020: ALT 9; BUN 11; Creatinine, Ser 0.58; Hemoglobin 11.8; Platelets 194; Potassium 3.8; Sodium 135  Recent Lipid Panel No results found for: CHOL, TRIG, HDL, CHOLHDL, VLDL, LDLCALC, LDLDIRECT   Risk Assessment/Calculations:      Physical Exam:    VS:  BP 110/76 (BP Location: Left Arm, Patient Position: Sitting, Cuff Size: Large)   Pulse (!) 118   Ht 5\' 3"  (1.6 m)   Wt 206 lb (93.4 kg)   LMP 07/03/2020   SpO2 99%   BMI 36.49 kg/m     Wt Readings from Last 3 Encounters:  11/16/20 206 lb (93.4 kg)  11/16/20 204 lb 1.6 oz (92.6 kg)  11/09/20 204 lb  (92.5 kg)     GEN:  Well nourished, well developed in no acute distress HEENT: Normal NECK: No JVD; No carotid bruits LYMPHATICS: No lymphadenopathy CARDIAC: RRR, no murmurs, rubs, gallops RESPIRATORY:  Clear to auscultation without rales, wheezing or rhonchi  ABDOMEN: Soft, non-tender, non-distended MUSCULOSKELETAL:  No edema; No deformity  SKIN: Warm and dry NEUROLOGIC:  Alert and oriented x 3 PSYCHIATRIC:  Normal affect   ASSESSMENT:    1. Palpitations   2. Leaky heart valve   3. Primary hypertension   4. Pregnancy, unspecified gestational age    PLAN:    In order of problems listed above:  1. Patient with palpitations, occurring 2 times a week.  Currently pregnant.  2-week cardiac monitor was normal and did not show any significant arrhythmias. 2. Echocardiogram showed normal systolic and diastolic function, trivial TR.  No significant valvular abnormalities noted.  Patient reassured. 3. History of hypertension, currently pregnant, BP normal on 2.5 mg amlodipine, very low dose.  stop amlodipine.  Follow-up in 1 month, if BP elevated, start labetalol or nifedipine ER. 4. Patient is currently [redacted] weeks pregnant, management as per OB GYN.  Follow-up in 1 month for BP check.       Medication Adjustments/Labs and Tests Ordered: Current medicines are reviewed at length with the patient today.  Concerns regarding medicines are outlined above.  No orders of the defined types were placed in this encounter.  No orders of the defined types were placed in this encounter.   Patient Instructions  Medication Instructions:  Your physician has recommended you make the following change in your medication:   STOP taking your Norvasc (amLODipine).  *If you need a refill on your cardiac medications before your next appointment, please call your pharmacy*   Lab Work: None Ordered If you have labs (blood work) drawn today and your tests are completely normal, you will receive your  results only by: 11/11/20 MyChart Message (if you have MyChart) OR . A paper copy in the mail If you have any lab test that is abnormal or we need to change your treatment, we will call you to review the results.   Testing/Procedures: None Ordered   Follow-Up: At [facility], you and your health needs are our priority.  As part of our continuing mission to provide you with exceptional heart care, we have created designated Provider Care Teams.  These Care Teams include your primary Cardiologist (physician) and Advanced Practice Providers (APPs -  Physician Assistants and Nurse Practitioners) who all work together to provide you with the care you need, when you need it.  We recommend signing up for  the patient portal called "MyChart".  Sign up information is provided on this After Visit Summary.  MyChart is used to connect with patients for Virtual Visits (Telemedicine).  Patients are able to view lab/test results, encounter notes, upcoming appointments, etc.  Non-urgent messages can be sent to your provider as well.   To learn more about what you can do with MyChart, go to [email or web address].    Your next appointment:   4-6 week(s)  The format for your next appointment:   In Person  Provider:   Debbe Odea, MD   Other Instructions      Signed, Debbe Odea, MD  11/16/2020 5:02 PM    [facility]

## 2020-11-23 ENCOUNTER — Other Ambulatory Visit: Payer: Medicaid Other

## 2020-11-23 ENCOUNTER — Ambulatory Visit (INDEPENDENT_AMBULATORY_CARE_PROVIDER_SITE_OTHER): Payer: Medicaid Other | Admitting: Obstetrics and Gynecology

## 2020-11-23 ENCOUNTER — Ambulatory Visit (INDEPENDENT_AMBULATORY_CARE_PROVIDER_SITE_OTHER): Payer: Medicaid Other

## 2020-11-23 ENCOUNTER — Encounter: Payer: Medicaid Other | Admitting: Obstetrics and Gynecology

## 2020-11-23 ENCOUNTER — Other Ambulatory Visit: Payer: Self-pay

## 2020-11-23 VITALS — BP 120/80 | Wt 204.0 lb

## 2020-11-23 DIAGNOSIS — Z3A30 30 weeks gestation of pregnancy: Secondary | ICD-10-CM

## 2020-11-23 DIAGNOSIS — O24410 Gestational diabetes mellitus in pregnancy, diet controlled: Secondary | ICD-10-CM | POA: Diagnosis not present

## 2020-11-23 DIAGNOSIS — O10019 Pre-existing essential hypertension complicating pregnancy, unspecified trimester: Secondary | ICD-10-CM | POA: Diagnosis not present

## 2020-11-23 DIAGNOSIS — O0990 Supervision of high risk pregnancy, unspecified, unspecified trimester: Secondary | ICD-10-CM | POA: Diagnosis not present

## 2020-11-23 DIAGNOSIS — Z3A27 27 weeks gestation of pregnancy: Secondary | ICD-10-CM

## 2020-11-23 DIAGNOSIS — Z113 Encounter for screening for infections with a predominantly sexual mode of transmission: Secondary | ICD-10-CM

## 2020-11-23 LAB — POCT URINALYSIS DIPSTICK OB
Glucose, UA: NEGATIVE
POC,PROTEIN,UA: NEGATIVE

## 2020-11-23 NOTE — Progress Notes (Signed)
ROB- u/s, no concerns

## 2020-11-23 NOTE — Progress Notes (Signed)
Routine Prenatal Care Visit  Subjective  Kristen Jefferson is a 35 y.o. G1P0 at [redacted]w[redacted]d being seen today for ongoing prenatal care.  She is currently monitored for the following issues for this high-risk pregnancy and has Irregular periods; History of irregular heartbeat; Supervision of high risk pregnancy, antepartum; Obesity (BMI 30-39.9); Advanced maternal age, primigravida; Benign essential hypertension, antepartum; Late prenatal care; Abnormal glucose tolerance affecting pregnancy, antepartum; and Gestational diabetes mellitus (GDM), antepartum on their problem list.  ----------------------------------------------------------------------------------- Patient reports intermittent mid to upper back pain. Patient states is occured a few days ago. Denies pain today. Patient states she has been checking her blood glucose levels. She did not bring her glucose log with her today but reports that fasting levels have been in the 90s (with one value in the 80s) and that her levels after meals have been 120s. Contractions: Not present. Vag. Bleeding: None.  Movement: Present. Denies leaking of fluid.  ----------------------------------------------------------------------------------- The following portions of the patient's history were reviewed and updated as appropriate: allergies, current medications, past family history, past medical history, past social history, past surgical history and problem list. Problem list updated.   Objective  Blood pressure 120/80, weight 204 lb (92.5 kg), last menstrual period 07/03/2020, unknown if currently breastfeeding. Pregravid weight 196 lb (88.9 kg) Total Weight Gain 8 lb (3.629 kg) Urinalysis:      Fetal Status: Fetal Heart Rate (bpm): 145   Movement: Present     Growth [place]: Singleton intrauterine pregnancy is visualized with FHR at 149 BPM.  Biometrics give an (U/S) Gestational age of [redacted]w[redacted]d and an (U/S) EDD of 01/27/2021; this correlates with the clinically  established Estimated Date of Delivery: 01/27/21.  Fetal presentation is Cephalic.  Placenta: posterior. Grade: 2 AFI: 14.6 cm Growth percentile is 47.7%.  AC percentile is 31.2%. EFW: 1609 g  ( 3 lb 9 oz )  General:  Alert, oriented and cooperative. Patient is in no acute distress.  Skin: Skin is warm and dry. No rash noted.   Cardiovascular: Normal heart rate noted  Respiratory: Normal respiratory effort, no problems with respiration noted  Abdomen: Soft, gravid, appropriate for gestational age. Pain/Pressure: Absent     Pelvic:  Cervical exam deferred        Extremities: Normal range of motion.     ental Status: Normal mood and affect. Normal behavior. Normal judgment and thought content.     Assessment   35 y.o. G1P0 at [redacted]w[redacted]d by  02/17/2021, by Ultrasound presenting for routine prenatal visit  Plan   pregnancy1 Problems (from 07/03/20 to present)    Problem Noted Resolved   Advanced maternal age, primigravida 10/08/2020 by Vena Austria, MD No   Benign essential hypertension, antepartum 10/08/2020 by Vena Austria, MD No   Overview Addendum 10/08/2020 12:11 PM by Vena Austria, MD    [X]  Aspirin 81 mg daily after 12 weeks; discontinue after 36 weeks [X]  baseline labs with CBC, CMP, urine protein/creatinine ratio [ ]  no BP meds unless BPs become elevated [ ]  ultrasound for growth at 28, 32, 36 weeks [ ]  Baseline EKG   Current antihypertensives:  amlodipine  Baseline and surveillance labs (pulled in from EPIC, refresh links as needed)  No results found for: PLT, CREATININE, AST, ALT, PROTCRRATIO, LABPROT, PROTEIN24HR  Antenatal Testing CHTN - O10.919  Group I  BP < 140/90, no preeclampsia, AGA,  nml AFV, +/- meds    Group II BP > 140/90, on meds, no preeclampsia, AGA, nml AFV  20-28-34-38  20-24-28-32-35-38  32//2 x wk  28//BPP wkly then 32//2 x wk  40 no meds; 39 meds  PRN or 37  Pre-eclampsia  GHTN - O13.9/Preeclampsia without severe features  -  O14.00   Preeclampsia with severe features - O14.10  Q 3-4wks  Q 2 wks  28//BPP wkly then 32//2 x wk  Inpatient  37  PRN or 34         Previous Version   Irregular periods 09/21/2020 by Zipporah Plants, CNM No   History of irregular heartbeat 09/21/2020 by Zipporah Plants, CNM No   Supervision of high risk pregnancy, antepartum 09/21/2020 by Zipporah Plants, CNM No   Overview Addendum 11/23/2020  1:55 PM by Zipporah Plants, CNM    Clinic [facility] Prenatal Labs  Dating 24 week [place] Blood type: A pos  Genetic Screen NIPS: Normal XY Inheritest: negative CF, Fragile-X, and SMA Antibody: negative  Anatomic [place] Normal 10/08/2020 Rubella: Non-Immune Varicella: Immune  GTT Early 156 RPR: NR  Rhogam N/A HBsAg: negative  TDaP vaccine                       Flu Shot: HIV: negative  Baby Food                                GBS:   Contraception  Pap: 09/21/2020 ASCUS HPV negative  CBB   Trichomonas + NOB  CS/VBAC    Support Person  Robert    HTN - amlodipine discontinued per cards GDM  AMA Late prenatal care      Previous Version   Obesity (BMI 30-39.9) 09/21/2020 by Zipporah Plants, CNM No   Overview Addendum 09/21/2020  9:20 AM by Zipporah Plants, CNM    BMI >=35.0-39.9 [ ]  early 1h gtt - ordered  [ ]  u/s for dating [ ]  - ordered [x ] nutritional goals - reviewed recommendations [ ]  folic acid 1mg  [ ]  bASA (>12 weeks) [ ]  consider nutrition consult [ ]  consider maternal EKG 1st trimester - ordered 12/3 [ ]  Growth u/s 28 [ ] , 32 [ ] , 36 weeks [ ]  [ ]  NST/AFI weekly 37+ weeks (37[] , 38[] , 39[] , 40[] ) [ ]  IOL by 41 weeks (scheduled, prn [] )       Previous Version   Chronic hypertension 09/21/2020 by , CNM 10/08/2020 by , MD   Overview Signed 09/21/2020  9:13 AM by , CNM    [ ]  Aspirin 81 mg daily after 12 weeks; discontinue after 36 weeks [ ]  baseline labs with CBC, CMP, urine protein/creatinine ratio [ ]  no BP meds unless BPs become elevated [ ]   ultrasound for growth at 28, 32, 36 weeks [ ]  Aspirin 81 mg daily after 12 weeks; discontinue after 36 weeks [ ]  Baseline EKG   Current antihypertensives:  amlodipine  Baseline and surveillance labs (pulled in from EPIC, refresh links as needed)  No results found for: PLT, CREATININE, AST, ALT, PROTCRRATIO, LABPROT, PROTEIN24HR  Antenatal Testing CHTN - O10.919  Group I  BP < 140/90, no preeclampsia, AGA,  nml AFV, +/- meds    Group II BP > 140/90, on meds, no preeclampsia, AGA, nml AFV  20-28-34-38  20-24-28-32-35-38  32//2 x wk  28//BPP wkly then 32//2 x wk  40 no meds; 39 meds  PRN or 37  Pre-eclampsia  GHTN - O13.9/Preeclampsia without severe features  - O14.00   Preeclampsia with severe  features - O14.10  Q 3-4wks  Q 2 wks  28//BPP wkly then 32//2 x wk  Inpatient  37  PRN or 34            -Reviewed growth US findings with patient -Patient with questions regarding in of pregnancy and when to prepare to go to hospital. All questions answered Reviewed options for childbirth education. -Patient encouraged to bring blood glucose log to each visit  Preterm labor precautions including but not limited to vaginal bleeding, contractions, leaking of fluid and fetal movement were reviewed in detail with the patient.    Return in about 2 weeks (around 12/07/2020) for HROB - see MD.  Zipporah Plants, CNM, MSN [facility], [facility] 11/23/2020, 2:04 PM

## 2020-11-24 LAB — CBC WITH DIFFERENTIAL/PLATELET
Basophils Absolute: 0 10*3/uL (ref 0.0–0.2)
Basos: 0 %
EOS (ABSOLUTE): 0.1 10*3/uL (ref 0.0–0.4)
Eos: 1 %
Hematocrit: 33.8 % — ABNORMAL LOW (ref 34.0–46.6)
Hemoglobin: 11.4 g/dL (ref 11.1–15.9)
Immature Grans (Abs): 0.1 10*3/uL (ref 0.0–0.1)
Immature Granulocytes: 1 %
Lymphocytes Absolute: 1.4 10*3/uL (ref 0.7–3.1)
Lymphs: 15 %
MCH: 28.4 pg (ref 26.6–33.0)
MCHC: 33.7 g/dL (ref 31.5–35.7)
MCV: 84 fL (ref 79–97)
Monocytes Absolute: 0.5 10*3/uL (ref 0.1–0.9)
Monocytes: 6 %
Neutrophils Absolute: 7.1 10*3/uL — ABNORMAL HIGH (ref 1.4–7.0)
Neutrophils: 77 %
Platelets: 185 10*3/uL (ref 150–450)
RBC: 4.02 x10E6/uL (ref 3.77–5.28)
RDW: 14.7 % (ref 11.7–15.4)
WBC: 9.2 10*3/uL (ref 3.4–10.8)

## 2020-11-24 LAB — RPR QUALITATIVE: RPR Ser Ql: NONREACTIVE

## 2020-11-24 LAB — HIV ANTIBODY (ROUTINE TESTING W REFLEX): HIV Screen 4th Generation wRfx: NONREACTIVE

## 2020-11-30 ENCOUNTER — Encounter: Payer: Self-pay | Admitting: Dietician

## 2020-11-30 ENCOUNTER — Other Ambulatory Visit: Payer: Self-pay

## 2020-11-30 ENCOUNTER — Encounter: Payer: Medicaid Other | Attending: Obstetrics and Gynecology | Admitting: Dietician

## 2020-11-30 VITALS — BP 108/72 | Ht 63.0 in | Wt 201.2 lb

## 2020-11-30 DIAGNOSIS — O24410 Gestational diabetes mellitus in pregnancy, diet controlled: Secondary | ICD-10-CM | POA: Diagnosis not present

## 2020-11-30 NOTE — Patient Instructions (Signed)
   Good job eating all your meals and snacks and having some carb and protein together.   Keep eating plenty of vegetables, include at least one veggie or fruit with each meal. Make veggie portions big, and portions of starchy foods 1 cup (fist size) or less.   Stay active, moving often during each day.

## 2020-11-30 NOTE — Progress Notes (Signed)
.   Patient's BG record indicates fasting BGs ranging 89-93, and post-meal BGs ranging 120-128. Marland Kitchen Patient's food diary indicates eating at regular intervals, meals all contain protein sources, with some variation in carb amounts; patient is including vegetables with some meals. Snacks have been fruit cups. Patient reports frequent milk consumption. . Provided basic balanced meal plan, and wrote individualized menus based on patient's food preferences. . Instructed on food portions and meal planning process; encouraged inclusion of a vegetable and/or fruit with all meals. Advised 8oz portions of milk, especially when with meals (to count as a carb serving) . Instructed patient on food safety, including avoidance of Listeriosis, and limiting mercury from fish. . Discussed importance of maintaining healthy lifestyle habits to reduce risk of Type 2 DM as well as Gestational DM with any future pregnancies. . Advised patient to use any remaining testing supplies to test some BGs after delivery, and to have BG tested ideally annually, as well as prior to attempting future pregnancies.

## 2020-12-07 ENCOUNTER — Encounter: Payer: Self-pay | Admitting: Obstetrics and Gynecology

## 2020-12-07 ENCOUNTER — Other Ambulatory Visit: Payer: Self-pay

## 2020-12-07 ENCOUNTER — Ambulatory Visit (INDEPENDENT_AMBULATORY_CARE_PROVIDER_SITE_OTHER): Payer: Medicaid Other | Admitting: Obstetrics and Gynecology

## 2020-12-07 VITALS — BP 110/70 | Wt 205.2 lb

## 2020-12-07 DIAGNOSIS — Z3A29 29 weeks gestation of pregnancy: Secondary | ICD-10-CM

## 2020-12-07 DIAGNOSIS — O24410 Gestational diabetes mellitus in pregnancy, diet controlled: Secondary | ICD-10-CM

## 2020-12-07 DIAGNOSIS — Z23 Encounter for immunization: Secondary | ICD-10-CM

## 2020-12-07 DIAGNOSIS — O0990 Supervision of high risk pregnancy, unspecified, unspecified trimester: Secondary | ICD-10-CM

## 2020-12-07 DIAGNOSIS — O10019 Pre-existing essential hypertension complicating pregnancy, unspecified trimester: Secondary | ICD-10-CM

## 2020-12-07 NOTE — Patient Instructions (Signed)

## 2020-12-07 NOTE — Progress Notes (Signed)
Routine Prenatal Care Visit  Subjective  Kristen Jefferson is a 35 y.o. G1P0 at [redacted]w[redacted]d being seen today for ongoing prenatal care.  She is currently monitored for the following issues for this high-risk pregnancy and has Irregular periods; History of irregular heartbeat; Supervision of high risk pregnancy, antepartum; Obesity (BMI 30-39.9); Advanced maternal age, primigravida; Benign essential hypertension, antepartum; Late prenatal care; Abnormal glucose tolerance affecting pregnancy, antepartum; and Gestational diabetes mellitus (GDM), antepartum on their problem list.  ----------------------------------------------------------------------------------- Patient reports no complaints.   Contractions: Not present. Vag. Bleeding: None.  Movement: Present. Denies leaking of fluid.  ----------------------------------------------------------------------------------- The following portions of the patient's history were reviewed and updated as appropriate: allergies, current medications, past family history, past medical history, past social history, past surgical history and problem list. Problem list updated.   Objective  Blood pressure 110/70, weight 205 lb 3.2 oz (93.1 kg), last menstrual period 07/03/2020, unknown if currently breastfeeding. Pregravid weight 196 lb (88.9 kg) Total Weight Gain 9 lb 3.2 oz (4.173 kg) Urinalysis:      Fetal Status: Fetal Heart Rate (bpm): present   Movement: Present     General:  Alert, oriented and cooperative. Patient is in no acute distress.  Skin: Skin is warm and dry. No rash noted.   Cardiovascular: Normal heart rate noted  Respiratory: Normal respiratory effort, no problems with respiration noted  Abdomen: Soft, gravid, appropriate for gestational age. Pain/Pressure: Absent     Pelvic:  Cervical exam deferred        Extremities: Normal range of motion.  Edema: None  Mental Status: Normal mood and affect. Normal behavior. Normal judgment and thought  content.     Assessment   35 y.o. G1P0 at [redacted]w[redacted]d by  02/17/2021, by Ultrasound presenting for routine prenatal visit  Plan   pregnancy1 Problems (from 07/03/20 to present)    Problem Noted Resolved   Advanced maternal age, primigravida 10/08/2020 by Vena Austria, MD No   Benign essential hypertension, antepartum 10/08/2020 by Vena Austria, MD No   Overview Addendum 10/08/2020 12:11 PM by Vena Austria, MD    [X]  Aspirin 81 mg daily after 12 weeks; discontinue after 36 weeks [X]  baseline labs with CBC, CMP, urine protein/creatinine ratio [ ]  no BP meds unless BPs become elevated [ ]  ultrasound for growth at 28, 32, 36 weeks [ ]  Baseline EKG   Current antihypertensives:  amlodipine  Baseline and surveillance labs (pulled in from EPIC, refresh links as needed)  No results found for: PLT, CREATININE, AST, ALT, PROTCRRATIO, LABPROT, PROTEIN24HR  Antenatal Testing CHTN - O10.919  Group I  BP < 140/90, no preeclampsia, AGA,  nml AFV, +/- meds    Group II BP > 140/90, on meds, no preeclampsia, AGA, nml AFV  20-28-34-38  20-24-28-32-35-38  32//2 x wk  28//BPP wkly then 32//2 x wk  40 no meds; 39 meds  PRN or 37  Pre-eclampsia  GHTN - O13.9/Preeclampsia without severe features  - O14.00   Preeclampsia with severe features - O14.10  Q 3-4wks  Q 2 wks  28//BPP wkly then 32//2 x wk  Inpatient  37  PRN or 34         Previous Version   Irregular periods 09/21/2020 by , CNM No   History of irregular heartbeat 09/21/2020 by 07-21-1972, CNM No   Supervision of high risk pregnancy, antepartum 09/21/2020 by 14/12/2019, CNM No   Overview Addendum 11/23/2020  1:55 PM by 14/12/2019, CNM  Clinic [facility] Prenatal Labs  Dating 24 week [place] Blood type: A pos  Genetic Screen NIPS: Normal XY Inheritest: negative CF, Fragile-X, and SMA Antibody: negative  Anatomic [place] Normal 10/08/2020 Rubella: Non-Immune Varicella: Immune  GTT Early 156 RPR: NR   Rhogam N/A HBsAg: negative  TDaP vaccine                       Flu Shot: HIV: negative  Baby Food                                GBS:   Contraception  Pap: 09/21/2020 ASCUS HPV negative  CBB   Trichomonas + NOB  CS/VBAC    Support Person  Robert    HTN - amlodipine discontinued per cards GDM  AMA Late prenatal care      Previous Version   Obesity (BMI 30-39.9) 09/21/2020 by Zipporah Plants, CNM No   Overview Addendum 09/21/2020  9:20 AM by Zipporah Plants, CNM    BMI >=35.0-39.9 [ ]  early 1h gtt - ordered  [ ]  u/s for dating [ ]  - ordered [x ] nutritional goals - reviewed recommendations [ ]  folic acid 1mg  [ ]  bASA (>12 weeks) [ ]  consider nutrition consult [ ]  consider maternal EKG 1st trimester - ordered 12/3 [ ]  Growth u/s 28 [ ] , 32 [ ] , 36 weeks [ ]  [ ]  NST/AFI weekly 37+ weeks (37[] , 38[] , 39[] , 40[] ) [ ]  IOL by 41 weeks (scheduled, prn [] )       Previous Version   Chronic hypertension 09/21/2020 by , CNM 10/08/2020 by , MD   Overview Signed 09/21/2020  9:13 AM by , CNM    [ ]  Aspirin 81 mg daily after 12 weeks; discontinue after 36 weeks [ ]  baseline labs with CBC, CMP, urine protein/creatinine ratio [ ]  no BP meds unless BPs become elevated [ ]  ultrasound for growth at 28, 32, 36 weeks [ ]  Aspirin 81 mg daily after 12 weeks; discontinue after 36 weeks [ ]  Baseline EKG   Current antihypertensives:  amlodipine  Baseline and surveillance labs (pulled in from EPIC, refresh links as needed)  No results found for: PLT, CREATININE, AST, ALT, PROTCRRATIO, LABPROT, PROTEIN24HR  Antenatal Testing CHTN - O10.919  Group I  BP < 140/90, no preeclampsia, AGA,  nml AFV, +/- meds    Group II BP > 140/90, on meds, no preeclampsia, AGA, nml AFV  20-28-34-38  20-24-28-32-35-38  32//2 x wk  28//BPP wkly then 32//2 x wk  40 no meds; 39 meds  PRN or 37  Pre-eclampsia  GHTN - O13.9/Preeclampsia without severe features  - O14.00    Preeclampsia with severe features - O14.10  Q 3-4wks  Q 2 wks  28//BPP wkly then 32//2 x wk  Inpatient  37  PRN or 34              Patient did not bring glucose log but reports normal fasting and postprandials values. Encouraged her to send a copy of her log through mychart and/or bring to her next visit. Stressed importance.  MFM referral and for conflicting gender scan vs Maternit21 result. Will need TDAP next visit   Gestational age appropriate obstetric precautions including but not limited to vaginal bleeding, contractions, leaking of fluid and fetal movement were reviewed in detail with the patient.    Return in about 2 weeks (around 12/21/2020),  or if symptoms worsen or fail to improve, for ROB in person with MD.  Natale Milch MD [facility], [facility] 12/07/2020, 5:53 PM

## 2020-12-13 ENCOUNTER — Telehealth: Payer: Self-pay | Admitting: Obstetrics and Gynecology

## 2020-12-13 NOTE — Telephone Encounter (Signed)
Kristen Jefferson is scheduled for an ultrasound, MFM consultation and genetic counseling due to the finding of the fetal gender discrepancy on NIPS and u/s.  She was scheduled for 12/18/20 and cannot come that day.  She will speak with her husband and find out the best day to come.  I explained that we are here on Tuesday/Thursday but that our [place] office is available all week. When I hear back from her, we will reschedule at her convenience.  We may be reached at [phone].  [name], MS, CGC

## 2020-12-18 ENCOUNTER — Other Ambulatory Visit: Payer: Medicaid Other

## 2020-12-21 ENCOUNTER — Other Ambulatory Visit: Payer: Self-pay

## 2020-12-21 ENCOUNTER — Encounter: Payer: Self-pay | Admitting: Advanced Practice Midwife

## 2020-12-21 ENCOUNTER — Ambulatory Visit (INDEPENDENT_AMBULATORY_CARE_PROVIDER_SITE_OTHER): Payer: Medicaid Other | Admitting: Advanced Practice Midwife

## 2020-12-21 VITALS — BP 118/76 | Wt 207.0 lb

## 2020-12-21 DIAGNOSIS — O24410 Gestational diabetes mellitus in pregnancy, diet controlled: Secondary | ICD-10-CM

## 2020-12-21 DIAGNOSIS — O0993 Supervision of high risk pregnancy, unspecified, third trimester: Secondary | ICD-10-CM

## 2020-12-21 DIAGNOSIS — O09513 Supervision of elderly primigravida, third trimester: Secondary | ICD-10-CM

## 2020-12-21 DIAGNOSIS — Z3A31 31 weeks gestation of pregnancy: Secondary | ICD-10-CM

## 2020-12-21 NOTE — Progress Notes (Signed)
Routine Prenatal Care Visit  Subjective  Kristen Jefferson is a 35 y.o. G1P0 at [redacted]w[redacted]d being seen today for ongoing prenatal care.  She is currently monitored for the following issues for this high-risk pregnancy and has Irregular periods; History of irregular heartbeat; Supervision of high risk pregnancy, antepartum; Obesity (BMI 30-39.9); Advanced maternal age, primigravida; Benign essential hypertension, antepartum; Late prenatal care; Abnormal glucose tolerance affecting pregnancy, antepartum; and Gestational diabetes mellitus (GDM), antepartum on their problem list.  ----------------------------------------------------------------------------------- Patient reports no complaints.  She doesn't always remember to check her BS and she forgot her BS log. She is reminded to check her BS 4x daily and bring log to visits. Contractions: Not present. Vag. Bleeding: None.  Movement: Present. Leaking Fluid denies.  ----------------------------------------------------------------------------------- The following portions of the patient's history were reviewed and updated as appropriate: allergies, current medications, past family history, past medical history, past social history, past surgical history and problem list. Problem list updated.  Objective  Blood pressure 118/76, weight 207 lb (93.9 kg), last menstrual period 07/03/2020, unknown if currently breastfeeding. Pregravid weight 196 lb (88.9 kg) Total Weight Gain 11 lb (4.99 kg) Urinalysis: Urine Protein    Urine Glucose    Fetal Status: Fetal Heart Rate (bpm): 148 Fundal Height: 32 cm Movement: Present     General:  Alert, oriented and cooperative. Patient is in no acute distress.  Skin: Skin is warm and dry. No rash noted.   Cardiovascular: Normal heart rate noted  Respiratory: Normal respiratory effort, no problems with respiration noted  Abdomen: Soft, gravid, appropriate for gestational age. Pain/Pressure: Absent     Pelvic:  Cervical  exam deferred        Extremities: Normal range of motion.  Edema: None  Mental Status: Normal mood and affect. Normal behavior. Normal judgment and thought content.   Assessment   35 y.o. G1P0 at [redacted]w[redacted]d by  02/17/2021, by Ultrasound presenting for routine prenatal visit  Plan   pregnancy1 Problems (from 07/03/20 to present)    Problem Noted Resolved   Advanced maternal age, primigravida 10/08/2020 by Vena Austria, MD No   Benign essential hypertension, antepartum 10/08/2020 by Vena Austria, MD No   Overview Addendum 10/08/2020 12:11 PM by Vena Austria, MD    [X]  Aspirin 81 mg daily after 12 weeks; discontinue after 36 weeks [X]  baseline labs with CBC, CMP, urine protein/creatinine ratio [ ]  no BP meds unless BPs become elevated [ ]  ultrasound for growth at 28, 32, 36 weeks [ ]  Baseline EKG   Current antihypertensives:  amlodipine  Baseline and surveillance labs (pulled in from EPIC, refresh links as needed)  No results found for: PLT, CREATININE, AST, ALT, PROTCRRATIO, LABPROT, PROTEIN24HR  Antenatal Testing CHTN - O10.919  Group I  BP < 140/90, no preeclampsia, AGA,  nml AFV, +/- meds    Group II BP > 140/90, on meds, no preeclampsia, AGA, nml AFV  20-28-34-38  20-24-28-32-35-38  32//2 x wk  28//BPP wkly then 32//2 x wk  40 no meds; 39 meds  PRN or 37  Pre-eclampsia  GHTN - O13.9/Preeclampsia without severe features  - O14.00   Preeclampsia with severe features - O14.10  Q 3-4wks  Q 2 wks  28//BPP wkly then 32//2 x wk  Inpatient  37  PRN or 34         Previous Version   Irregular periods 09/21/2020 by , CNM No   History of irregular heartbeat 09/21/2020 by 07-21-1972, CNM No   Supervision of  high risk pregnancy, antepartum 09/21/2020 by Zipporah Plants, CNM No   Overview Addendum 11/23/2020  1:55 PM by Zipporah Plants, CNM    [facility] Prenatal Labs  Dating 24 week [place] Blood type: A pos  Genetic Screen NIPS: Normal XY  Inheritest: negative CF, Fragile-X, and SMA Antibody: negative  Anatomic [place] Normal 10/08/2020 Rubella: Non-Immune Varicella: Immune  GTT Early 156 RPR: NR  Rhogam N/A HBsAg: negative  TDaP vaccine                       Flu Shot: HIV: negative  Baby Food                                GBS:   Contraception  Pap: 09/21/2020 ASCUS HPV negative  CBB   Trichomonas + NOB  CS/VBAC    Support Person  Robert    HTN - amlodipine discontinued per cards GDM  AMA Late prenatal care      Previous Version   Obesity (BMI 30-39.9) 09/21/2020 by Zipporah Plants, CNM No   Overview Addendum 09/21/2020  9:20 AM by Zipporah Plants, CNM    BMI >=35.0-39.9 [ ]  early 1h gtt - ordered  [ ]  u/s for dating [ ]  - ordered [x ] nutritional goals - reviewed recommendations [ ]  folic acid 1mg  [ ]  bASA (>12 weeks) [ ]  consider nutrition consult [ ]  consider maternal EKG 1st trimester - ordered 12/3 [ ]  Growth u/s 28 [ ] , 32 [ ] , 36 weeks [ ]  [ ]  NST/AFI weekly 37+ weeks (37[] , 38[] , 39[] , 40[] ) [ ]  IOL by 41 weeks (scheduled, prn [] )       Previous Version   Chronic hypertension 09/21/2020 by , CNM 10/08/2020 by , MD   Overview Signed 09/21/2020  9:13 AM by , CNM    [ ]  Aspirin 81 mg daily after 12 weeks; discontinue after 36 weeks [ ]  baseline labs with CBC, CMP, urine protein/creatinine ratio [ ]  no BP meds unless BPs become elevated [ ]  ultrasound for growth at 28, 32, 36 weeks [ ]  Aspirin 81 mg daily after 12 weeks; discontinue after 36 weeks [ ]  Baseline EKG   Current antihypertensives:  amlodipine  Baseline and surveillance labs (pulled in from EPIC, refresh links as needed)  No results found for: PLT, CREATININE, AST, ALT, PROTCRRATIO, LABPROT, PROTEIN24HR  Antenatal Testing CHTN - O10.919  Group I  BP < 140/90, no preeclampsia, AGA,  nml AFV, +/- meds    Group II BP > 140/90, on meds, no preeclampsia, AGA, nml AFV  20-28-34-38  20-24-28-32-35-38  32//2  x wk  28//BPP wkly then 32//2 x wk  40 no meds; 39 meds  PRN or 37  Pre-eclampsia  GHTN - O13.9/Preeclampsia without severe features  - O14.00   Preeclampsia with severe features - O14.10  Q 3-4wks  Q 2 wks  28//BPP wkly then 32//2 x wk  Inpatient  37  PRN or 34           Blood Sugar: per patient report: 80s/90s fasting and 120s after meals Check sugars 4x daily and bring log to visits   Preterm labor symptoms and general obstetric precautions including but not limited to vaginal bleeding, contractions, leaking of fluid and fetal movement were reviewed in detail with the patient.    Return in about 2 weeks (around 01/04/2021) for rob.  ,  CNM 12/21/2020 2:24 PM

## 2020-12-21 NOTE — Progress Notes (Signed)
No vb. No lof.  

## 2020-12-28 ENCOUNTER — Encounter: Payer: Self-pay | Admitting: Cardiology

## 2020-12-28 ENCOUNTER — Other Ambulatory Visit: Payer: Self-pay

## 2020-12-28 ENCOUNTER — Ambulatory Visit (INDEPENDENT_AMBULATORY_CARE_PROVIDER_SITE_OTHER): Payer: Medicaid Other | Admitting: Cardiology

## 2020-12-28 VITALS — BP 118/60 | HR 110 | Ht 63.0 in | Wt 206.0 lb

## 2020-12-28 DIAGNOSIS — I10 Essential (primary) hypertension: Secondary | ICD-10-CM | POA: Diagnosis not present

## 2020-12-28 DIAGNOSIS — Z3490 Encounter for supervision of normal pregnancy, unspecified, unspecified trimester: Secondary | ICD-10-CM

## 2020-12-28 NOTE — Patient Instructions (Signed)
Medication Instructions:   1. Your physician recommends that you continue on your current medications as directed. Please refer to the Current Medication list given to you today.  *If you need a refill on your cardiac medications before your next appointment, please call your pharmacy*   Lab Work:  1. None Ordered  If you have labs (blood work) drawn today and your tests are completely normal, you will receive your results only by: . MyChart Message (if you have MyChart) OR . A paper copy in the mail If you have any lab test that is abnormal or we need to change your treatment, we will call you to review the results.   Testing/Procedures:  1. None Ordered   Follow-Up: At [facility], you and your health needs are our priority.  As part of our continuing mission to provide you with exceptional heart care, we have created designated Provider Care Teams.  These Care Teams include your primary Cardiologist (physician) and Advanced Practice Providers (APPs -  Physician Assistants and Nurse Practitioners) who all work together to provide you with the care you need, when you need it.  We recommend signing up for the patient portal called "MyChart".  Sign up information is provided on this After Visit Summary.  MyChart is used to connect with patients for Virtual Visits (Telemedicine).  Patients are able to view lab/test results, encounter notes, upcoming appointments, etc.  Non-urgent messages can be sent to your provider as well.   To learn more about what you can do with MyChart, go to [email or web address].    Your next appointment:   As needed  

## 2020-12-28 NOTE — Progress Notes (Signed)
Cardiology Office Note:    Date:  12/28/2020   ID:  Kristen Jefferson, DOB [birth year], MRN [id number]  PCP:  Zipporah Plants, CNM  [facility] Cardiologist:  No primary care provider on file.  [facility] Electrophysiologist:  None   Referring MD: Zipporah Plants, CNM   Chief Complaint  Patient presents with  . Other    6 week follow up. Patient denies symptoms at this time. Meds reviewed verbally with patient.     History of Present Illness:    Kristen Jefferson is a 35 y.o. female with a hx of hypertension, GERD, currently [redacted] weeks pregnant who presents for follow-up.    Previously seen for a few regular heartbeats and palpitations.  Echocardiogram and cardiac monitor will unrevealing.  Was on low-dose blood pressure medicine/amlodipine 2.5 mg daily this was stopped.  She now presents for BP check.  Blood pressures have stayed normal of antihypertensive.  She feels well, denies any symptoms of chest pain, shortness of breath, palpitations.  Has no concerns at this time..   Past Medical History:  Diagnosis Date  . GERD (gastroesophageal reflux disease)   . Gestational diabetes   . Hypertension   . Irregular heart beat   . Vitamin D deficiency     History reviewed. No pertinent surgical history.  Current Medications: Current Meds  Medication Sig  . Accu-Chek Softclix Lancets lancets 1 each by Other route 4 (four) times daily.  Marland Kitchen aspirin EC 81 MG tablet Take 1 tablet (81 mg total) by mouth daily. Take after 12 weeks for prevention of preeclampssia later in pregnancy  . Blood Glucose Monitoring Suppl MISC Accucheck Guide me strips please.Patient has the monitor.  Marland Kitchen glucose blood test strip UCheck blood glucose four times daily as directed (fasting, and 2-hr post breakfast, lunch, dinner)  . loratadine (CLARITIN) 10 MG tablet Take 10 mg by mouth daily as needed.  . meclizine (ANTIVERT) 25 MG tablet Take 25 mg by mouth 2 (two) times daily as needed.  Marland Kitchen omeprazole (PRILOSEC)  40 MG capsule Take 40 mg by mouth daily.  Burnis Medin w/o A Vit-FeFum-FePo-FA (CONCEPT OB) 130-92.4-1 MG CAPS Take 1 capsule by mouth daily.     Allergies:   Patient has no known allergies.   Social History   Socioeconomic History  . Marital status: Married    Spouse name: Not on file  . Number of children: Not on file  . Years of education: Not on file  . Highest education level: Not on file  Occupational History  . Not on file  Tobacco Use  . Smoking status: Former Smoker    Packs/day: 0.75    Years: 1.00    Pack years: 0.75    Quit date: 10/20/2013    Years since quitting: 7.1  . Smokeless tobacco: Never Used  Vaping Use  . Vaping Use: Never used  Substance and Sexual Activity  . Alcohol use: Never  . Drug use: Never  . Sexual activity: Yes  Other Topics Concern  . Not on file  Social History Narrative  . Not on file   Social Determinants of Health   Financial Resource Strain: Not on file  Food Insecurity: Not on file  Transportation Needs: Not on file  Physical Activity: Not on file  Stress: Not on file  Social Connections: Not on file     Family History: The patient's family history includes Cancer in her maternal grandmother and mother.  ROS:   Please see the history  of present illness.     All other systems reviewed and are negative.  EKGs/Labs/Other Studies Reviewed:    The following studies were reviewed today:   EKG:  EKG not ordered today.   Recent Labs: 09/21/2020: ALT 9; BUN 11; Creatinine, Ser 0.58; Potassium 3.8; Sodium 135 11/23/2020: Hemoglobin 11.4; Platelets 185  Recent Lipid Panel No results found for: CHOL, TRIG, HDL, CHOLHDL, VLDL, LDLCALC, LDLDIRECT   Risk Assessment/Calculations:      Physical Exam:    VS:  BP 118/60 (BP Location: Left Arm, Patient Position: Sitting, Cuff Size: Large)   Pulse (!) 110   Ht 5\' 3"  (1.6 m)   Wt 206 lb (93.4 kg)   LMP 07/03/2020   SpO2 98%   BMI 36.49 kg/m     Wt Readings from Last 3  Encounters:  12/28/20 206 lb (93.4 kg)  12/21/20 207 lb (93.9 kg)  12/07/20 205 lb 3.2 oz (93.1 kg)     GEN:  Well nourished, well developed in no acute distress HEENT: Normal NECK: No JVD; No carotid bruits LYMPHATICS: No lymphadenopathy CARDIAC: RRR, no murmurs, rubs, gallops RESPIRATORY:  Clear to auscultation without rales, wheezing or rhonchi  ABDOMEN: Soft, non-tender, non-distended MUSCULOSKELETAL:  No edema; No deformity  SKIN: Warm and dry NEUROLOGIC:  Alert and oriented x 3 PSYCHIATRIC:  Normal affect   ASSESSMENT:    1. Primary hypertension   2. Pregnancy, unspecified gestational age    PLAN:    In order of problems listed above:  1. History of hypertension, currently pregnant, BP normal off antihypertensives.  Continue to monitor patient off of antihypertensives.  Patient advised to check blood pressure frequently, low-salt diet recommended. 2. Patient is currently [redacted] weeks pregnant, management as per OB GYN.  Follow-up as needed.       Medication Adjustments/Labs and Tests Ordered: Current medicines are reviewed at length with the patient today.  Concerns regarding medicines are outlined above.  No orders of the defined types were placed in this encounter.  No orders of the defined types were placed in this encounter.   Patient Instructions  Medication Instructions:   1. Your physician recommends that you continue on your current medications as directed. Please refer to the Current Medication list given to you today.  *If you need a refill on your cardiac medications before your next appointment, please call your pharmacy*   Lab Work:  1. None Ordered  If you have labs (blood work) drawn today and your tests are completely normal, you will receive your results only by: 12/09/20 MyChart Message (if you have MyChart) OR . A paper copy in the mail If you have any lab test that is abnormal or we need to change your treatment, we will call you to review the  results.   Testing/Procedures:  1. None Ordered   Follow-Up: At [facility], you and your health needs are our priority.  As part of our continuing mission to provide you with exceptional heart care, we have created designated Provider Care Teams.  These Care Teams include your primary Cardiologist (physician) and Advanced Practice Providers (APPs -  Physician Assistants and Nurse Practitioners) who all work together to provide you with the care you need, when you need it.  We recommend signing up for the patient portal called "MyChart".  Sign up information is provided on this After Visit Summary.  MyChart is used to connect with patients for Virtual Visits (Telemedicine).  Patients are able to view lab/test results, encounter  notes, upcoming appointments, etc.  Non-urgent messages can be sent to your provider as well.   To learn more about what you can do with MyChart, go to [email or web address].    Your next appointment:  As needed          Signed, Debbe Odea, MD  12/28/2020 5:03 PM    [facility]

## 2021-01-01 ENCOUNTER — Telehealth: Payer: Self-pay | Admitting: Obstetrics and Gynecology

## 2021-01-01 NOTE — Telephone Encounter (Signed)
Kristen Jefferson was referred to Maternal Fetal Care for an ultrasound, genetic counseling and MFM consultation due to discrepant fetal gender between the NIPS results (female) and anatomy ultrasound (female).  The patient declined to reschedule and stated that they will wait until delivery for more information.  She was encouraged to call back if she changes her mind or has questions. Chromosome analysis on cord blood could be helpful at the time of delivery to further assess this baby.  We can be reached at [phone].  [name], MS, CGC

## 2021-01-04 ENCOUNTER — Encounter: Payer: Self-pay | Admitting: Obstetrics and Gynecology

## 2021-01-04 ENCOUNTER — Ambulatory Visit (INDEPENDENT_AMBULATORY_CARE_PROVIDER_SITE_OTHER): Payer: Medicaid Other | Admitting: Obstetrics and Gynecology

## 2021-01-04 ENCOUNTER — Other Ambulatory Visit: Payer: Self-pay

## 2021-01-04 ENCOUNTER — Other Ambulatory Visit (HOSPITAL_COMMUNITY)
Admission: RE | Admit: 2021-01-04 | Discharge: 2021-01-04 | Disposition: A | Discharge status: Home / Self Care | Payer: Medicaid Other | Source: Ambulatory Visit | Attending: Obstetrics | Admitting: Obstetrics

## 2021-01-04 VITALS — BP 122/74 | Wt 204.0 lb

## 2021-01-04 DIAGNOSIS — Z3A36 36 weeks gestation of pregnancy: Secondary | ICD-10-CM

## 2021-01-04 DIAGNOSIS — O24410 Gestational diabetes mellitus in pregnancy, diet controlled: Secondary | ICD-10-CM | POA: Diagnosis present

## 2021-01-04 DIAGNOSIS — O0990 Supervision of high risk pregnancy, unspecified, unspecified trimester: Secondary | ICD-10-CM | POA: Insufficient documentation

## 2021-01-04 DIAGNOSIS — Z8679 Personal history of other diseases of the circulatory system: Secondary | ICD-10-CM

## 2021-01-04 DIAGNOSIS — O09513 Supervision of elderly primigravida, third trimester: Secondary | ICD-10-CM

## 2021-01-04 DIAGNOSIS — O10019 Pre-existing essential hypertension complicating pregnancy, unspecified trimester: Secondary | ICD-10-CM

## 2021-01-04 DIAGNOSIS — E669 Obesity, unspecified: Secondary | ICD-10-CM

## 2021-01-04 LAB — OB RESULTS CONSOLE GC/CHLAMYDIA: Gonorrhea: NEGATIVE

## 2021-01-04 NOTE — Progress Notes (Signed)
Routine Prenatal Care Visit  Subjective  Kristen Jefferson is a 35 y.o. G1P0 at [redacted]w[redacted]d being seen today for ongoing prenatal care.  She is currently monitored for the following issues for this high-risk pregnancy and has Irregular periods; History of irregular heartbeat; Supervision of high risk pregnancy, antepartum; Obesity (BMI 30-39.9); Advanced maternal age, primigravida; Benign essential hypertension, antepartum; Late prenatal care; Abnormal glucose tolerance affecting pregnancy, antepartum; and Gestational diabetes mellitus (GDM), antepartum on their problem list.  ----------------------------------------------------------------------------------- Patient reports no complaints.   Contractions: Not present. Vag. Bleeding: None.  Movement: Present. Leaking Fluid denies.  CHTN: normal BPs. No sx. GDMA1?: Did not bring BG log. States most values > 120, fasting ~119?  She is unsure.  ----------------------------------------------------------------------------------- The following portions of the patient's history were reviewed and updated as appropriate: allergies, current medications, past family history, past medical history, past social history, past surgical history and problem list. Problem list updated.  Objective  Blood pressure 122/74, weight 204 lb (92.5 kg), last menstrual period 07/03/2020, unknown if currently breastfeeding. Pregravid weight 196 lb (88.9 kg) Total Weight Gain 8 lb (3.629 kg) Urinalysis: Urine Protein    Urine Glucose    Fetal Status: Fetal Heart Rate (bpm): 140 Fundal Height: 36 cm Movement: Present  Presentation: Vertex  General:  Alert, oriented and cooperative. Patient is in no acute distress.  Skin: Skin is warm and dry. No rash noted.   Cardiovascular: Normal heart rate noted  Respiratory: Normal respiratory effort, no problems with respiration noted  Abdomen: Soft, gravid, appropriate for gestational age. Pain/Pressure: Absent     Pelvic:  Cervical exam  deferred        Extremities: Normal range of motion.  Edema: None  Mental Status: Normal mood and affect. Normal behavior. Normal judgment and thought content.   NST: Baseline FHR: 150 beats/min Variability: moderate Accelerations: present Decelerations: absent Tocometry: not done  Interpretation:  INDICATIONS: gestational diabetes mellitus and chronic hypertension RESULTS:  A NST procedure was performed with FHR monitoring and a normal baseline established, appropriate time of 20-40 minutes of evaluation, and accels >2 seen w 15x15 characteristics.  Results show a REACTIVE NST.    Bedside growth ultrasound: EFW: 2,838 grams (6 lb 4 oz), 36.6 %ile. MVP: 3.2 cm (normal) Presentation: cephalic  Assessment   35 y.o. G1P0 at [redacted]w[redacted]d by  01/27/2021, by Ultrasound presenting for routine prenatal visit  Plan   pregnancy1 Problems (from 07/03/20 to present)    Problem Noted Resolved   Advanced maternal age, primigravida 10/08/2020 by Vena Austria, MD No   Benign essential hypertension, antepartum 10/08/2020 by Vena Austria, MD No   Overview Addendum 10/08/2020 12:11 PM by Vena Austria, MD    [X]  Aspirin 81 mg daily after 12 weeks; discontinue after 36 weeks [X]  baseline labs with CBC, CMP, urine protein/creatinine ratio [ ]  no BP meds unless BPs become elevated [ ]  ultrasound for growth at 28, 32, 36 weeks [ ]  Baseline EKG   Current antihypertensives:  amlodipine  Baseline and surveillance labs (pulled in from EPIC, refresh links as needed)  No results found for: PLT, CREATININE, AST, ALT, PROTCRRATIO, LABPROT, PROTEIN24HR  Antenatal Testing CHTN - O10.919  Group I  BP < 140/90, no preeclampsia, AGA,  nml AFV, +/- meds    Group II BP > 140/90, on meds, no preeclampsia, AGA, nml AFV  20-28-34-38  20-24-28-32-35-38  32//2 x wk  28//BPP wkly then 32//2 x wk  40 no meds; 39 meds  PRN or 37  Pre-eclampsia  GHTN - O13.9/Preeclampsia without severe features  -  O14.00   Preeclampsia with severe features - O14.10  Q 3-4wks  Q 2 wks  28//BPP wkly then 32//2 x wk  Inpatient  37  PRN or 34         Previous Version   Irregular periods 09/21/2020 by Zipporah Plants, CNM No   History of irregular heartbeat 09/21/2020 by Zipporah Plants, CNM No   Supervision of high risk pregnancy, antepartum 09/21/2020 by Zipporah Plants, CNM No   Overview Addendum 11/23/2020  1:55 PM by Zipporah Plants, CNM    [facility] Prenatal Labs  Dating 24 week [place] Blood type: A pos  Genetic Screen NIPS: Normal XY Inheritest: negative CF, Fragile-X, and SMA Antibody: negative  Anatomic [place] Normal 10/08/2020 Rubella: Non-Immune Varicella: Immune  GTT Early 156 RPR: NR  Rhogam N/A HBsAg: negative  TDaP vaccine                       Flu Shot: HIV: negative  Baby Food                                GBS:   Contraception  Pap: 09/21/2020 ASCUS HPV negative  CBB   Trichomonas + NOB  CS/VBAC    Support Person  Robert    HTN - amlodipine discontinued per cards GDM  AMA Late prenatal care      Previous Version   Obesity (BMI 30-39.9) 09/21/2020 by Zipporah Plants, CNM No   Overview Addendum 09/21/2020  9:20 AM by Zipporah Plants, CNM    BMI >=35.0-39.9 [ ]  early 1h gtt - ordered  [ ]  u/s for dating [ ]  - ordered [x ] nutritional goals - reviewed recommendations [ ]  folic acid 1mg  [ ]  bASA (>12 weeks) [ ]  consider nutrition consult [ ]  consider maternal EKG 1st trimester - ordered 12/3 [ ]  Growth u/s 28 [ ] , 32 [ ] , 36 weeks [ ]  [ ]  NST/AFI weekly 37+ weeks (37[] , 38[] , 39[] , 40[] ) [ ]  IOL by 41 weeks (scheduled, prn [] )       Previous Version   Chronic hypertension 09/21/2020 by , CNM 10/08/2020 by , MD   Overview Signed 09/21/2020  9:13 AM by , CNM    [ ]  Aspirin 81 mg daily after 12 weeks; discontinue after 36 weeks [ ]  baseline labs with CBC, CMP, urine protein/creatinine ratio [ ]  no BP meds unless BPs become elevated [ ]   ultrasound for growth at 28, 32, 36 weeks [ ]  Aspirin 81 mg daily after 12 weeks; discontinue after 36 weeks [ ]  Baseline EKG   Current antihypertensives:  amlodipine  Baseline and surveillance labs (pulled in from EPIC, refresh links as needed)  No results found for: PLT, CREATININE, AST, ALT, PROTCRRATIO, LABPROT, PROTEIN24HR  Antenatal Testing CHTN - O10.919  Group I  BP < 140/90, no preeclampsia, AGA,  nml AFV, +/- meds    Group II BP > 140/90, on meds, no preeclampsia, AGA, nml AFV  20-28-34-38  20-24-28-32-35-38  32//2 x wk  28//BPP wkly then 32//2 x wk  40 no meds; 39 meds  PRN or 37  Pre-eclampsia  GHTN - O13.9/Preeclampsia without severe features  - O14.00   Preeclampsia with severe features - O14.10  Q 3-4wks  Q 2 wks  28//BPP wkly then 32//2 x wk  Inpatient  37  PRN  or 34             Preterm labor symptoms and general obstetric precautions including but not limited to vaginal bleeding, contractions, leaking of fluid and fetal movement were reviewed in detail with the patient. Please refer to After Visit Summary for other counseling recommendations.   -GBS/aptima today  - EDD adjusted after review of earliest ultrasound on 10/08/2021.  Adjusted EDD 4/10.   Due to inability to obtain a growth ultrasound within the next couple of weeks, I performed a bedside ultrasound with the above-noted results. I explained the limitations of this ultrasound and the possible inaccuracy associated with late pregnancy ultrasounds.    Return in about 4 days (around 01/08/2021) for Routine Prenatal Appointment w NST (and again in 1 week).   Thomasene Mohair, MD, [facility], [facility] 01/04/2021 4:44 PM

## 2021-01-06 LAB — STREP GP B NAA: Strep Gp B NAA: NEGATIVE

## 2021-01-08 LAB — CERVICOVAGINAL ANCILLARY ONLY
Chlamydia: NEGATIVE
Comment: NEGATIVE
Comment: NEGATIVE
Comment: NORMAL
Neisseria Gonorrhea: NEGATIVE
Trichomonas: NEGATIVE

## 2021-01-11 ENCOUNTER — Other Ambulatory Visit: Payer: Self-pay

## 2021-01-11 ENCOUNTER — Ambulatory Visit (INDEPENDENT_AMBULATORY_CARE_PROVIDER_SITE_OTHER): Payer: Medicaid Other | Admitting: Obstetrics and Gynecology

## 2021-01-11 VITALS — BP 120/80 | Wt 205.0 lb

## 2021-01-11 DIAGNOSIS — O10019 Pre-existing essential hypertension complicating pregnancy, unspecified trimester: Secondary | ICD-10-CM

## 2021-01-11 DIAGNOSIS — O0990 Supervision of high risk pregnancy, unspecified, unspecified trimester: Secondary | ICD-10-CM

## 2021-01-11 DIAGNOSIS — O24410 Gestational diabetes mellitus in pregnancy, diet controlled: Secondary | ICD-10-CM

## 2021-01-11 DIAGNOSIS — O09513 Supervision of elderly primigravida, third trimester: Secondary | ICD-10-CM

## 2021-01-11 DIAGNOSIS — Z3A37 37 weeks gestation of pregnancy: Secondary | ICD-10-CM

## 2021-01-11 NOTE — Patient Instructions (Addendum)
PRE ADMISSION TESTING For Covid, prior to procedure Friday 9:00-10:00 [facility] entrance (drive up)  Results in 35-00 hours You will not receive notification if test results are negative. If positive for Covid19, your provider will notify you by phone, with additional instructions.           Present for Induction of Labor on 01/20/21 at 08:00 AM    Labor Induction Labor induction is when steps are taken to cause a pregnant woman to begin the labor process. Most women go into labor on their own between 37 weeks and 42 weeks of pregnancy. When this does not happen, or when there is a medical need for labor to begin, steps may be taken to induce, or bring on, labor. Labor induction causes a pregnant woman's uterus to contract. It also causes the cervix to soften (ripen), open (dilate), and thin out. Usually, labor is not induced before 39 weeks of pregnancy unless there is a medical reason to do so. When is labor induction considered? Labor induction may be right for you if:  Your pregnancy lasts longer than 41 to 42 weeks.  Your placenta is separating from your uterus (placental abruption).  You have a rupture of membranes and your labor does not begin.  You have health problems, like diabetes or high blood pressure (preeclampsia) during your pregnancy.  Your baby has stopped growing or does not have enough amniotic fluid. Before labor induction begins, your health care provider will consider the following factors:  Your medical condition and the baby's condition.  How many weeks you have been pregnant.  How mature the baby's lungs are.  The condition of your cervix.  The position of the baby.  The size of your birth canal. Tell a health care provider about:  Any allergies you have.  All medicines you are taking, including vitamins, herbs, eye drops, creams, and over-the-counter medicines.  Any problems you or your family members have had with anesthetic  medicines.  Any surgeries you have had.  Any blood disorders you have.  Any medical conditions you have. What are the risks? Generally, this is a safe procedure. However, problems may occur, including:  Failed induction.  Changes in fetal heart rate, such as being too high, too low, or irregular (erratic).  Infection in the mother or the baby.  Increased risk of having a cesarean delivery.  Breaking off (abruption) of the placenta from the uterus. This is rare.  Rupture of the uterus. This is very rare.  Your baby could fail to get enough blood flow or oxygen. This can be life-threatening. When induction is needed for medical reasons, the benefits generally outweigh the risks. What happens during the procedure? During the procedure, your health care provider will use one of these methods to induce labor:  Stripping the membranes. In this method, the amniotic sac tissue is gently separated from the cervix. This causes the following to happen: ? Your cervix stretches, which in turn causes the release of prostaglandins. ? Prostaglandins induce labor and cause the uterus to contract. ? This procedure is often done in an office visit. You will be sent home to wait for contractions to begin.  Prostaglandin medicine. This medicine starts contractions and causes the cervix to dilate and ripen. This can be taken by mouth (orally) or by being inserted into the vagina (suppository).  Inserting a small, thin tube (catheter) with a balloon into the vagina and then expanding the balloon with water to dilate the cervix.  Breaking the water. In this method, a small instrument is used to make a small hole in the amniotic sac. This eventually causes the amniotic sac to break. Contractions should begin within a few hours.  Medicine to trigger or strengthen contractions. This medicine is given through an IV that is inserted into a vein in your arm. This procedure may vary among health care  providers and hospitals.   Where to find more information  [employer]: www.marchofdimes.org  [employer]: www.acog.org Summary  Labor induction causes a pregnant woman's uterus to contract. It also causes the cervix to soften (ripen), open (dilate), and thin out.  Labor is usually not induced before 39 weeks of pregnancy unless there is a medical reason to do so.  When induction is needed for medical reasons, the benefits generally outweigh the risks.  Talk with your health care provider about which methods of labor induction are right for you. This information is not intended to replace advice given to you by your health care provider. Make sure you discuss any questions you have with your health care provider. Document Revised: 07/19/2020 Document Reviewed: 07/19/2020 Elsevier Patient Education  2021 [employer].

## 2021-01-18 ENCOUNTER — Other Ambulatory Visit: Payer: Self-pay

## 2021-01-18 ENCOUNTER — Ambulatory Visit (INDEPENDENT_AMBULATORY_CARE_PROVIDER_SITE_OTHER): Payer: Medicaid Other | Admitting: Obstetrics and Gynecology

## 2021-01-18 VITALS — BP 122/80 | Wt 205.0 lb

## 2021-01-18 DIAGNOSIS — O0990 Supervision of high risk pregnancy, unspecified, unspecified trimester: Secondary | ICD-10-CM | POA: Diagnosis not present

## 2021-01-18 DIAGNOSIS — O09513 Supervision of elderly primigravida, third trimester: Secondary | ICD-10-CM

## 2021-01-18 DIAGNOSIS — Z3A38 38 weeks gestation of pregnancy: Secondary | ICD-10-CM

## 2021-01-18 DIAGNOSIS — O10019 Pre-existing essential hypertension complicating pregnancy, unspecified trimester: Secondary | ICD-10-CM

## 2021-01-18 DIAGNOSIS — O24410 Gestational diabetes mellitus in pregnancy, diet controlled: Secondary | ICD-10-CM

## 2021-01-18 NOTE — Progress Notes (Signed)
Routine Prenatal Care Visit  Subjective  Kristen Jefferson is a 35 y.o. G1P0 at [redacted]w[redacted]d being seen today for ongoing prenatal care.  She is currently monitored for the following issues for this high-risk pregnancy and has Irregular periods; History of irregular heartbeat; Supervision of high risk pregnancy, antepartum; Obesity (BMI 30-39.9); Advanced maternal age, primigravida; Benign essential hypertension, antepartum; Late prenatal care; Abnormal glucose tolerance affecting pregnancy, antepartum; and Gestational diabetes mellitus (GDM), antepartum on their problem list.  ----------------------------------------------------------------------------------- Patient reports no complaints.   Contractions: Not present. Vag. Bleeding: None.  Movement: Present. Denies leaking of fluid.  ----------------------------------------------------------------------------------- The following portions of the patient's history were reviewed and updated as appropriate: allergies, current medications, past family history, past medical history, past social history, past surgical history and problem list. Problem list updated.   Objective  Blood pressure 120/80, weight 205 lb (93 kg), last menstrual period 07/03/2020, unknown if currently breastfeeding. Pregravid weight 196 lb (88.9 kg) Total Weight Gain 9 lb (4.082 kg) Urinalysis:      Fetal Status: Fetal Heart Rate (bpm): 140 (RNST) Fundal Height: 39 cm Movement: Present  Presentation: Vertex  NONSTRESS TEST INTERPRETATION  INDICATIONS: chronic hypertension FHR baseline: 140 RESULTS:  A NST procedure was performed with FHR monitoring and a normal baseline established, appropriate time of 20-40 minutes of evaluation, and accels >2 seen w 15x15 characteristics.  Results show a REACTIVE NST.    General:  Alert, oriented and cooperative. Patient is in no acute distress.  Skin: Skin is warm and dry. No rash noted.   Cardiovascular: Normal heart rate noted   Respiratory: Normal respiratory effort, no problems with respiration noted  Abdomen: Soft, gravid, appropriate for gestational age. Pain/Pressure: Present     Pelvic:  Cervical exam deferred        Extremities: Normal range of motion.     ental Status: Normal mood and affect. Normal behavior. Normal judgment and thought content.     Assessment   35 y.o. G1P0 at [redacted]w[redacted]d by  01/27/2021, by Ultrasound presenting for routine prenatal visit  Plan   pregnancy1 Problems (from 07/03/20 to present)    Problem Noted Resolved   Advanced maternal age, primigravida 10/08/2020 by Vena Austria, MD No   Benign essential hypertension, antepartum 10/08/2020 by Vena Austria, MD No   Overview Addendum 10/08/2020 12:11 PM by Vena Austria, MD    [X]  Aspirin 81 mg daily after 12 weeks; discontinue after 36 weeks [X]  baseline labs with CBC, CMP, urine protein/creatinine ratio [ ]  no BP meds unless BPs become elevated [ ]  ultrasound for growth at 28, 32, 36 weeks [ ]  Baseline EKG   Current antihypertensives:  amlodipine  Baseline and surveillance labs (pulled in from EPIC, refresh links as needed)  No results found for: PLT, CREATININE, AST, ALT, PROTCRRATIO, LABPROT, PROTEIN24HR  Antenatal Testing CHTN - O10.919  Group I  BP < 140/90, no preeclampsia, AGA,  nml AFV, +/- meds    Group II BP > 140/90, on meds, no preeclampsia, AGA, nml AFV  20-28-34-38  20-24-28-32-35-38  32//2 x wk  28//BPP wkly then 32//2 x wk  40 no meds; 39 meds  PRN or 37  Pre-eclampsia  GHTN - O13.9/Preeclampsia without severe features  - O14.00   Preeclampsia with severe features - O14.10  Q 3-4wks  Q 2 wks  28//BPP wkly then 32//2 x wk  Inpatient  37  PRN or 34         Previous Version   Irregular  periods 09/21/2020 by Zipporah Plants, CNM No   History of irregular heartbeat 09/21/2020 by Zipporah Plants, CNM No   Supervision of high risk pregnancy, antepartum 09/21/2020 by Zipporah Plants, CNM No    Overview Addendum 11/23/2020  1:55 PM by Zipporah Plants, CNM    [facility]inic [facility] Prenatal Labs  Dating 24 week [place] Blood type: A pos  Genetic Screen NIPS: Normal XY Inheritest: negative CF, Fragile-X, and SMA Antibody: negative  Anatomic [place] Normal 10/08/2020 Rubella: Non-Immune Varicella: Immune  GTT Early 156 RPR: NR  Rhogam N/A HBsAg: negative  TDaP vaccine                       Flu Shot: HIV: negative  Baby Food                                GBS:   Contraception  Pap: 09/21/2020 ASCUS HPV negative  CBB   Trichomonas + NOB  CS/VBAC    Support Person  Robert    HTN - amlodipine discontinued per cards GDM  AMA Late prenatal care      Previous Version   Obesity (BMI 30-39.9) 09/21/2020 by Zipporah Plants, CNM No   Overview Addendum 09/21/2020  9:20 AM by Zipporah Plants, CNM    BMI >=35.0-39.9 [ ]  early 1h gtt - ordered  [ ]  u/s for dating [ ]  - ordered [x ] nutritional goals - reviewed recommendations [ ]  folic acid 1mg  [ ]  bASA (>12 weeks) [ ]  consider nutrition consult [ ]  consider maternal EKG 1st trimester - ordered 12/3 [ ]  Growth u/s 28 [ ] , 32 [ ] , 36 weeks [ ]  [ ]  NST/AFI weekly 37+ weeks (37[] , 38[] , 39[] , 40[] ) [ ]  IOL by 41 weeks (scheduled, prn [] )       Previous Version   Chronic hypertension 09/21/2020 by , CNM 10/08/2020 by , MD   Overview Signed 09/21/2020  9:13 AM by , CNM    [ ]  Aspirin 81 mg daily after 12 weeks; discontinue after 36 weeks [ ]  baseline labs with CBC, CMP, urine protein/creatinine ratio [ ]  no BP meds unless BPs become elevated [ ]  ultrasound for growth at 28, 32, 36 weeks [ ]  Aspirin 81 mg daily after 12 weeks; discontinue after 36 weeks [ ]  Baseline EKG   Current antihypertensives:  amlodipine  Baseline and surveillance labs (pulled in from EPIC, refresh links as needed)  No results found for: PLT, CREATININE, AST, ALT, PROTCRRATIO, LABPROT, PROTEIN24HR  Antenatal Testing CHTN - O10.919  Group  I  BP < 140/90, no preeclampsia, AGA,  nml AFV, +/- meds    Group II BP > 140/90, on meds, no preeclampsia, AGA, nml AFV  20-28-34-38  20-24-28-32-35-38  32//2 x wk  28//BPP wkly then 32//2 x wk  40 no meds; 39 meds  PRN or 37  Pre-eclampsia  GHTN - O13.9/Preeclampsia without severe features  - O14.00   Preeclampsia with severe features - O14.10  Q 3-4wks  Q 2 wks  28//BPP wkly then 32//2 x wk  Inpatient  37  PRN or 34            -Discussed recommendations for timing of delivery - 39 wks due to A1 GDM, chronic hypertension - currently without antihypertensive therapy -Reviewed glucose log - values overall well-controlled  Term labor precautions including but not limited to vaginal bleeding, contractions, leaking of  fluid and fetal movement were reviewed in detail with the patient.    Keep previously scheduled ROB.  Zipporah Plants, CNM, MSN [facility], [facility] 01/18/2021, 10:08 AM

## 2021-01-18 NOTE — Progress Notes (Signed)
Routine Prenatal Care Visit  Subjective  Kristen Jefferson is a 35 y.o. G1P0 at [redacted]w[redacted]d being seen today for ongoing prenatal care.  She is currently monitored for the following issues for this high-risk pregnancy and has Irregular periods; History of irregular heartbeat; Supervision of high risk pregnancy, antepartum; Obesity (BMI 30-39.9); Advanced maternal age, primigravida; Benign essential hypertension, antepartum; Late prenatal care; Abnormal glucose tolerance affecting pregnancy, antepartum; and Gestational diabetes mellitus (GDM), antepartum on their problem list.  ----------------------------------------------------------------------------------- Patient reports no complaints.  Patient expresses anxiety around upcoming IOL. Contractions: Not present. Vag. Bleeding: None.  Movement: Present. Denies leaking of fluid.  ----------------------------------------------------------------------------------- The following portions of the patient's history were reviewed and updated as appropriate: allergies, current medications, past family history, past medical history, past social history, past surgical history and problem list. Problem list updated.   Objective  Blood pressure 122/80, weight 205 lb (93 kg), last menstrual period 07/03/2020, unknown if currently breastfeeding. Pregravid weight 196 lb (88.9 kg) Total Weight Gain 9 lb (4.082 kg) Urinalysis:      Fetal Status: Fetal Heart Rate (bpm): 145 (RNST) Fundal Height: 39 cm Movement: Present  Presentation: Vertex   NONSTRESS TEST INTERPRETATION  INDICATIONS: gestational diabetes mellitus FHR baseline: 145 bmp RESULTS:  A NST procedure was performed with FHR monitoring and a normal baseline established, appropriate time of 20-40 minutes of evaluation, and accels >2 seen w 15x15 characteristics.  Results show a REACTIVE NST.    General:  Alert, oriented and cooperative. Patient is in no acute distress.  Skin: Skin is warm and dry. No  rash noted.   Cardiovascular: Normal heart rate noted  Respiratory: Normal respiratory effort, no problems with respiration noted  Abdomen: Soft, gravid, appropriate for gestational age. Pain/Pressure: Absent     Pelvic:  Cervical exam deferred        Extremities: Normal range of motion.  Edema: None  ental Status: Normal mood and affect. Normal behavior. Normal judgment and thought content.     Assessment   35 y.o. G1P0 at [redacted]w[redacted]d by  01/27/2021, by Ultrasound presenting for routine prenatal visit  Plan   pregnancy1 Problems (from 07/03/20 to present)    Problem Noted Resolved   Advanced maternal age, primigravida 10/08/2020 by Vena Austria, MD No   Benign essential hypertension, antepartum 10/08/2020 by Vena Austria, MD No   Overview Addendum 10/08/2020 12:11 PM by Vena Austria, MD    [X]  Aspirin 81 mg daily after 12 weeks; discontinue after 36 weeks [X]  baseline labs with CBC, CMP, urine protein/creatinine ratio [ ]  no BP meds unless BPs become elevated [ ]  ultrasound for growth at 28, 32, 36 weeks [ ]  Baseline EKG   Current antihypertensives:  amlodipine  Baseline and surveillance labs (pulled in from EPIC, refresh links as needed)  No results found for: PLT, CREATININE, AST, ALT, PROTCRRATIO, LABPROT, PROTEIN24HR  Antenatal Testing CHTN - O10.919  Group I  BP < 140/90, no preeclampsia, AGA,  nml AFV, +/- meds    Group II BP > 140/90, on meds, no preeclampsia, AGA, nml AFV  20-28-34-38  20-24-28-32-35-38  32//2 x wk  28//BPP wkly then 32//2 x wk  40 no meds; 39 meds  PRN or 37  Pre-eclampsia  GHTN - O13.9/Preeclampsia without severe features  - O14.00   Preeclampsia with severe features - O14.10  Q 3-4wks  Q 2 wks  28//BPP wkly then 32//2 x wk  Inpatient  37  PRN or 34  Previous Version   Irregular periods 09/21/2020 by Zipporah Plants, CNM No   History of irregular heartbeat 09/21/2020 by Zipporah Plants, CNM No   Supervision of high  risk pregnancy, antepartum 09/21/2020 by Zipporah Plants, CNM No   Overview Addendum 11/23/2020  1:55 PM by Zipporah Plants, CNM    [facility]inic [facility] Prenatal Labs  Dating 24 week [place] Blood type: A pos  Genetic Screen NIPS: Normal XY Inheritest: negative CF, Fragile-X, and SMA Antibody: negative  Anatomic [place] Normal 10/08/2020 Rubella: Non-Immune Varicella: Immune  GTT Early 156 RPR: NR  Rhogam N/A HBsAg: negative  TDaP vaccine   12/07/20  Flu Shot: 09/10/20 HIV: negative  Baby Food  breast                              GBS:   Contraception  IUD Pap: 09/21/2020 ASCUS HPV negative  CBB   Trichomonas + NOB  CS/VBAC    Support Person  Robert    HTN - amlodipine discontinued per cards GDM  AMA Late prenatal care      Previous Version   Obesity (BMI 30-39.9) 09/21/2020 by Zipporah Plants, CNM No   Overview Addendum 09/21/2020  9:20 AM by Zipporah Plants, CNM    BMI >=35.0-39.9 [ ]  early 1h gtt - ordered  [ ]  u/s for dating [ ]  - ordered [x ] nutritional goals - reviewed recommendations [ ]  folic acid 1mg  [ ]  bASA (>12 weeks) [ ]  consider nutrition consult [ ]  consider maternal EKG 1st trimester - ordered 12/3 [ ]  Growth u/s 28 [ ] , 32 [ ] , 36 weeks [ ]  [ ]  NST/AFI weekly 37+ weeks (37[] , 38[] , 39[] , 40[] ) [ ]  IOL by 41 weeks (scheduled, prn [] )       Previous Version   Chronic hypertension 09/21/2020 by , CNM 10/08/2020 by , MD   Overview Signed 09/21/2020  9:13 AM by , CNM    [ ]  Aspirin 81 mg daily after 12 weeks; discontinue after 36 weeks [ ]  baseline labs with CBC, CMP, urine protein/creatinine ratio [ ]  no BP meds unless BPs become elevated [ ]  ultrasound for growth at 28, 32, 36 weeks [ ]  Aspirin 81 mg daily after 12 weeks; discontinue after 36 weeks [ ]  Baseline EKG   Current antihypertensives:  amlodipine  Baseline and surveillance labs (pulled in from EPIC, refresh links as needed)  No results found for: PLT, CREATININE, AST, ALT,  PROTCRRATIO, LABPROT, PROTEIN24HR  Antenatal Testing CHTN - O10.919  Group I  BP < 140/90, no preeclampsia, AGA,  nml AFV, +/- meds    Group II BP > 140/90, on meds, no preeclampsia, AGA, nml AFV  20-28-34-38  20-24-28-32-35-38  32//2 x wk  28//BPP wkly then 32//2 x wk  40 no meds; 39 meds  PRN or 37  Pre-eclampsia  GHTN - O13.9/Preeclampsia without severe features  - O14.00   Preeclampsia with severe features - O14.10  Q 3-4wks  Q 2 wks  28//BPP wkly then 32//2 x wk  Inpatient  37  PRN or 34            -Reviewed process for IOL, all questions answered -Discussed inconsistent findings between NIPS testing and imaging regard fetal sex - patient currently believes she is having a girl - will further assess following delivery -No blood glucose log today, patient states her fasting values have been between "80-90" and her postprandial values  have about "around 120"  Term labor precautions including but not limited to vaginal bleeding, contractions, leaking of fluid and fetal movement were reviewed in detail with the patient.    Present for IOL on 01/20/21 at 0800  Zipporah Plants, CNM, MSN [facility], [facility] 01/18/2021, 3:19 PM

## 2021-01-20 ENCOUNTER — Inpatient Hospital Stay: Payer: Medicaid Other | Admitting: Anesthesiology

## 2021-01-20 ENCOUNTER — Inpatient Hospital Stay
Admission: EM | Admit: 2021-01-20 | Discharge: 2021-01-23 | DRG: 786 | Disposition: A | Discharge status: Home / Self Care | Payer: Medicaid Other | Attending: Obstetrics & Gynecology | Admitting: Obstetrics & Gynecology

## 2021-01-20 ENCOUNTER — Other Ambulatory Visit: Payer: Self-pay

## 2021-01-20 ENCOUNTER — Encounter: Payer: Self-pay | Admitting: Obstetrics & Gynecology

## 2021-01-20 DIAGNOSIS — O24420 Gestational diabetes mellitus in childbirth, diet controlled: Secondary | ICD-10-CM | POA: Diagnosis present

## 2021-01-20 DIAGNOSIS — O9081 Anemia of the puerperium: Secondary | ICD-10-CM | POA: Diagnosis not present

## 2021-01-20 DIAGNOSIS — O411230 Chorioamnionitis, third trimester, not applicable or unspecified: Secondary | ICD-10-CM | POA: Diagnosis present

## 2021-01-20 DIAGNOSIS — O24419 Gestational diabetes mellitus in pregnancy, unspecified control: Secondary | ICD-10-CM | POA: Diagnosis present

## 2021-01-20 DIAGNOSIS — O10019 Pre-existing essential hypertension complicating pregnancy, unspecified trimester: Secondary | ICD-10-CM

## 2021-01-20 DIAGNOSIS — Z23 Encounter for immunization: Secondary | ICD-10-CM

## 2021-01-20 DIAGNOSIS — Z3A39 39 weeks gestation of pregnancy: Secondary | ICD-10-CM | POA: Diagnosis not present

## 2021-01-20 DIAGNOSIS — Z87891 Personal history of nicotine dependence: Secondary | ICD-10-CM

## 2021-01-20 DIAGNOSIS — Z3490 Encounter for supervision of normal pregnancy, unspecified, unspecified trimester: Secondary | ICD-10-CM | POA: Diagnosis present

## 2021-01-20 DIAGNOSIS — O09513 Supervision of elderly primigravida, third trimester: Principal | ICD-10-CM

## 2021-01-20 DIAGNOSIS — Z8679 Personal history of other diseases of the circulatory system: Secondary | ICD-10-CM

## 2021-01-20 DIAGNOSIS — O99214 Obesity complicating childbirth: Secondary | ICD-10-CM | POA: Diagnosis present

## 2021-01-20 DIAGNOSIS — Z20822 Contact with and (suspected) exposure to covid-19: Secondary | ICD-10-CM | POA: Diagnosis present

## 2021-01-20 DIAGNOSIS — O1002 Pre-existing essential hypertension complicating childbirth: Secondary | ICD-10-CM | POA: Diagnosis present

## 2021-01-20 DIAGNOSIS — E669 Obesity, unspecified: Secondary | ICD-10-CM

## 2021-01-20 DIAGNOSIS — D62 Acute posthemorrhagic anemia: Secondary | ICD-10-CM | POA: Diagnosis not present

## 2021-01-20 DIAGNOSIS — O0990 Supervision of high risk pregnancy, unspecified, unspecified trimester: Secondary | ICD-10-CM

## 2021-01-20 DIAGNOSIS — N926 Irregular menstruation, unspecified: Secondary | ICD-10-CM

## 2021-01-20 LAB — RESP PANEL BY RT-PCR (FLU A&B, COVID) ARPGX2
Influenza A by PCR: NEGATIVE
Influenza B by PCR: NEGATIVE
SARS Coronavirus 2 by RT PCR: NEGATIVE

## 2021-01-20 LAB — BASIC METABOLIC PANEL
Anion gap: 11 (ref 5–15)
BUN: 13 mg/dL (ref 6–20)
CO2: 18 mmol/L — ABNORMAL LOW (ref 22–32)
Calcium: 8.7 mg/dL — ABNORMAL LOW (ref 8.9–10.3)
Chloride: 104 mmol/L (ref 98–111)
Creatinine, Ser: 0.61 mg/dL (ref 0.44–1.00)
GFR, Estimated: >60 mL/min (ref >60)
Glucose, Bld: 200 mg/dL — ABNORMAL HIGH (ref 70–99)
Potassium: 3.6 mmol/L (ref 3.5–5.1)
Sodium: 133 mmol/L — ABNORMAL LOW (ref 135–145)

## 2021-01-20 LAB — CBC
HCT: 37 % (ref 36.0–46.0)
Hemoglobin: 12.3 g/dL (ref 12.0–15.0)
MCH: 28.1 pg (ref 26.0–34.0)
MCHC: 33.2 g/dL (ref 30.0–36.0)
MCV: 84.5 fL (ref 80.0–100.0)
Platelets: 181 10*3/uL (ref 150–400)
RBC: 4.38 MIL/uL (ref 3.87–5.11)
RDW: 15.8 % — ABNORMAL HIGH (ref 11.5–15.5)
WBC: 13.1 10*3/uL — ABNORMAL HIGH (ref 4.0–10.5)
nRBC: 0 % (ref 0.0–0.2)

## 2021-01-20 LAB — TYPE AND SCREEN
ABO/RH(D): A POS
Antibody Screen: NEGATIVE

## 2021-01-20 LAB — ABO/RH: ABO/RH(D): A POS

## 2021-01-20 LAB — GLUCOSE, CAPILLARY
Glucose-Capillary: 149 mg/dL — ABNORMAL HIGH (ref 70–99)
Glucose-Capillary: 86 mg/dL (ref 70–99)
Glucose-Capillary: 105 mg/dL — ABNORMAL HIGH (ref 70–99)
Glucose-Capillary: 138 mg/dL — ABNORMAL HIGH (ref 70–99)
Glucose-Capillary: 113 mg/dL — ABNORMAL HIGH (ref 70–99)
Glucose-Capillary: 140 mg/dL — ABNORMAL HIGH (ref 70–99)
Glucose-Capillary: 106 mg/dL — ABNORMAL HIGH (ref 70–99)
Glucose-Capillary: 104 mg/dL — ABNORMAL HIGH (ref 70–99)
Glucose-Capillary: 127 mg/dL — ABNORMAL HIGH (ref 70–99)
Glucose-Capillary: 114 mg/dL — ABNORMAL HIGH (ref 70–99)
Glucose-Capillary: 97 mg/dL (ref 70–99)
Glucose-Capillary: 91 mg/dL (ref 70–99)
Glucose-Capillary: 109 mg/dL — ABNORMAL HIGH (ref 70–99)

## 2021-01-20 MED ORDER — LIDOCAINE HCL (PF) 1 % IJ SOLN
INTRAMUSCULAR | Status: DC | PRN
  Administered 2021-01-20: 3 mL

## 2021-01-20 MED ORDER — SOD CITRATE-CITRIC ACID 500-334 MG/5ML PO SOLN
30.0000 mL | ORAL | Status: DC | PRN
  Administered 2021-01-21: 30 mL via ORAL

## 2021-01-20 MED ORDER — INSULIN REGULAR(HUMAN) IN NACL 100-0.9 UT/100ML-% IV SOLN
INTRAVENOUS | Status: DC
  Administered 2021-01-20: 0.9 [IU]/h via INTRAVENOUS
  Filled 2021-01-20: qty 100

## 2021-01-20 MED ORDER — OXYTOCIN BOLUS FROM INFUSION: 333.0000 mL | Freq: Once | INTRAVENOUS | Status: DC

## 2021-01-20 MED ORDER — FENTANYL 2.5 MCG/ML W/ROPIVACAINE 0.15% IN NS 100 ML EPIDURAL (ARMC)
12.0000 mL/h | EPIDURAL | Status: DC
  Administered 2021-01-20: 12 mL/h via EPIDURAL
  Filled 2021-01-20: qty 100

## 2021-01-20 MED ORDER — MISOPROSTOL 25 MCG QUARTER TABLET
25.0000 ug | ORAL_TABLET | ORAL | Status: DC | PRN
  Administered 2021-01-20: 25 ug via VAGINAL
  Filled 2021-01-20: qty 1

## 2021-01-20 MED ORDER — ACETAMINOPHEN 325 MG PO TABS: 650.0000 mg | ORAL_TABLET | ORAL | Status: DC | PRN

## 2021-01-20 MED ORDER — DIPHENHYDRAMINE HCL 50 MG/ML IJ SOLN: 12.5000 mg | INTRAMUSCULAR | Status: DC | PRN

## 2021-01-20 MED ORDER — MISOPROSTOL 200 MCG PO TABS
ORAL_TABLET | ORAL | Status: AC
  Filled 2021-01-20: qty 4

## 2021-01-20 MED ORDER — LACTATED RINGERS IV SOLN
500.0000 mL | Freq: Once | INTRAVENOUS | Status: AC
  Administered 2021-01-21: 500 mL via INTRAVENOUS

## 2021-01-20 MED ORDER — LACTATED RINGERS IV SOLN: INTRAVENOUS | Status: DC

## 2021-01-20 MED ORDER — EPHEDRINE 5 MG/ML INJ: 10.0000 mg | INTRAVENOUS | Status: DC | PRN

## 2021-01-20 MED ORDER — ONDANSETRON HCL 4 MG/2ML IJ SOLN
4.0000 mg | Freq: Four times a day (QID) | INTRAMUSCULAR | Status: DC | PRN
  Administered 2021-01-20 – 2021-01-21 (×2): 4 mg via INTRAVENOUS
  Filled 2021-01-20 (×2): qty 2

## 2021-01-20 MED ORDER — LIDOCAINE HCL (PF) 1 % IJ SOLN
INTRAMUSCULAR | Status: AC
  Filled 2021-01-20: qty 30

## 2021-01-20 MED ORDER — OXYTOCIN-SODIUM CHLORIDE 30-0.9 UT/500ML-% IV SOLN
1.0000 m[IU]/min | INTRAVENOUS | Status: DC
Start: 2021-01-20 — End: 2021-01-21
  Administered 2021-01-20 – 2021-01-21 (×2): 2 m[IU]/min via INTRAVENOUS

## 2021-01-20 MED ORDER — FENTANYL 2.5 MCG/ML W/ROPIVACAINE 0.15% IN NS 100 ML EPIDURAL (ARMC)
EPIDURAL | Status: AC
  Filled 2021-01-20: qty 100

## 2021-01-20 MED ORDER — BUPIVACAINE HCL (PF) 0.25 % IJ SOLN
INTRAMUSCULAR | Status: DC | PRN
  Administered 2021-01-20: 6 mL via EPIDURAL

## 2021-01-20 MED ORDER — OXYTOCIN 10 UNIT/ML IJ SOLN
INTRAMUSCULAR | Status: AC
  Filled 2021-01-20: qty 2

## 2021-01-20 MED ORDER — LIDOCAINE-EPINEPHRINE (PF) 1.5 %-1:200000 IJ SOLN
INTRAMUSCULAR | Status: DC | PRN
  Administered 2021-01-20: 3 mL via PERINEURAL

## 2021-01-20 MED ORDER — BUPIVACAINE HCL (PF) 0.25 % IJ SOLN: INTRAMUSCULAR | Status: DC | PRN

## 2021-01-20 MED ORDER — TERBUTALINE SULFATE 1 MG/ML IJ SOLN: 0.2500 mg | Freq: Once | INTRAMUSCULAR | Status: DC | PRN

## 2021-01-20 MED ORDER — PHENYLEPHRINE 40 MCG/ML (10ML) SYRINGE FOR IV PUSH (FOR BLOOD PRESSURE SUPPORT): 80.0000 ug | PREFILLED_SYRINGE | INTRAVENOUS | Status: DC | PRN

## 2021-01-20 MED ORDER — OXYTOCIN-SODIUM CHLORIDE 30-0.9 UT/500ML-% IV SOLN
2.5000 [IU]/h | INTRAVENOUS | Status: DC
  Administered 2021-01-21: 2.5 [IU]/h via INTRAVENOUS
  Administered 2021-01-21: 15 [IU]/h via INTRAVENOUS
  Filled 2021-01-20 (×2): qty 1000

## 2021-01-20 MED ORDER — DEXTROSE IN LACTATED RINGERS 5 % IV SOLN: INTRAVENOUS | Status: DC

## 2021-01-20 MED ORDER — AMMONIA AROMATIC IN INHA
RESPIRATORY_TRACT | Status: AC
  Filled 2021-01-20: qty 10

## 2021-01-20 MED ORDER — LACTATED RINGERS IV SOLN
500.0000 mL | INTRAVENOUS | Status: DC | PRN
Start: 2021-01-20 — End: 2021-01-21
  Administered 2021-01-20 – 2021-01-21 (×2): 500 mL via INTRAVENOUS

## 2021-01-20 MED ORDER — DEXTROSE 50 % IV SOLN: 0.0000 mL | INTRAVENOUS | Status: DC | PRN

## 2021-01-20 MED ORDER — LIDOCAINE HCL (PF) 1 % IJ SOLN: 30.0000 mL | INTRAMUSCULAR | Status: DC | PRN

## 2021-01-20 NOTE — Progress Notes (Signed)
CNM notified of pt's initial CBG of 200. New order given for Q2 CBG's.

## 2021-01-20 NOTE — Progress Notes (Addendum)
Labor Progress Note  Kristen Jefferson is a 35 y.o. G1P0 at [redacted]w[redacted]d by 24 weeks ultrasound admitted for induction of labor due to Gestational diabetes and Hypertension.  Subjective: Patient is resting in bed. Reports sensation of contractions, rates current discomfort as 5/10, breathing well through contractions.  Objective: BP 133/70 (BP Location: Right Arm)   Pulse 98   Temp 98.7 F (37.1 C) (Axillary)   Resp 15   Ht 5\' 4"  (1.626 m)   Wt 93.4 kg   LMP 07/03/2020   BMI 35.36 kg/m  Notable VS details: wnl  Fetal Assessment: FHT:  FHR: 155 bpm, variability: moderate,  accelerations:  Present,  decelerations:  Absent Category/reactivity:  Category I UC:   regular, every 2-3 minutes SVE:  5/80-90/-2/mid/soft Membrane status: Intact Amniotic color: Clear  Labs: Lab Results  Component Value Date   WBC 13.1 (H) 01/20/2021   HGB 12.3 01/20/2021   HCT 37.0 01/20/2021   MCV 84.5 01/20/2021   PLT 181 01/20/2021    Assessment / Plan: Induction of labor due to gestational diabetes and cHTN,  progressing well on pitocin  Labor: Progressing on Pitocin, will continue to increase then AROM Preeclampsia:  No current S&S of PIH - will continue to monitor blood pressures closely  GDM: Currently on Endotool - Insulin infusion at 1.8 units/hour - most recent BG: 140 Fetal Wellbeing:  Category I Pain Control:  Labor support without medications I/D:  GBS negative; Covid-19 negative Anticipated MOD:  NSVD  03/22/2021, CNM 01/20/2021, 4:09 PM

## 2021-01-20 NOTE — Anesthesia Procedure Notes (Signed)
Epidural Patient location during procedure: OB Start time: 01/20/2021 10:43 PM End time: 01/20/2021 10:57 PM  Staffing Anesthesiologist: Yves Dill, MD Performed: anesthesiologist   Preanesthetic Checklist Completed: patient identified, IV checked, site marked, risks and benefits discussed, surgical consent, monitors and equipment checked, pre-op evaluation and timeout performed  Epidural Patient position: sitting Prep: Betadine Patient monitoring: heart rate, continuous pulse ox and blood pressure Approach: midline Location: L3-L4 Injection technique: LOR air  Needle:  Needle type: Tuohy  Needle gauge: 17 G Needle length: 9 cm and 9 Catheter type: closed end flexible Catheter size: 19 Gauge Test dose: negative and 1.5% lidocaine with Epi 1:200 K  Assessment Events: blood not aspirated, injection not painful, no injection resistance, no paresthesia and negative IV test  Additional Notes Time out called.  Patient placed in sitting position.  Back prepped and draped in sterile fashion.  A skin wjheal was made in the L3-L4 interspace with 1% Lidocaine plain.  A 17G Tuohy needle was advanced into the epidural space by a loss of resistance technique.  The epidural catheter was threaded 3 cm and the TD was negative.  The patient tolerated the procedure well and the catheter was affixed to the back in sterile fasion.Reason for block:procedure for pain

## 2021-01-20 NOTE — Anesthesia Preprocedure Evaluation (Addendum)
Anesthesia Evaluation  Patient identified by MRN, date of birth, ID band Patient awake    Reviewed: Allergy & Precautions, H&P , NPO status , Patient's Chart, lab work & pertinent test results  Airway Mallampati: II  TM Distance: <3 FB Neck ROM: full    Dental  (+) Chipped   Pulmonary former smoker,    Pulmonary exam normal        Cardiovascular Exercise Tolerance: Good hypertension, Normal cardiovascular exam     Neuro/Psych negative neurological ROS  negative psych ROS   GI/Hepatic Neg liver ROS, GERD  ,  Endo/Other  diabetes, Gestational  Renal/GU negative Renal ROS  negative genitourinary   Musculoskeletal negative musculoskeletal ROS (+)   Abdominal Normal abdominal exam  (+)   Peds negative pediatric ROS (+)  Hematology negative hematology ROS (+)   Anesthesia Other Findings Past Medical History: No date: GERD (gastroesophageal reflux disease) No date: Gestational diabetes No date: Hypertension No date: Irregular heart beat No date: Vitamin D deficiency  Reproductive/Obstetrics (+) Pregnancy                            Anesthesia Physical Anesthesia Plan  ASA: III  Anesthesia Plan: Epidural   Post-op Pain Management:    Induction:   PONV Risk Score and Plan:   Airway Management Planned: Natural Airway  Additional Equipment:   Intra-op Plan:   Post-operative Plan:   Informed Consent: I have reviewed the patients History and Physical, chart, labs and discussed the procedure including the risks, benefits and alternatives for the proposed anesthesia with the patient or authorized representative who has indicated his/her understanding and acceptance.     Dental advisory given  Plan Discussed with: CRNA and Surgeon  Anesthesia Plan Comments: (Patient reports no bleeding problems and no anticoagulant use.   Patient consented for risks of anesthesia including but  not limited to:  - adverse reactions to medications - risk of bleeding, infection and or nerve damage from epidural that could lead to paralysis - risk of headache or failed epidural - nerve damage due to positioning - that if epidural is used for C-section that there is a chance of epidural failure requiring spinal placement or conversion to GA - Damage to heart, brain, lungs, other parts of body or loss of life  Patient voiced understanding.)       Anesthesia Quick Evaluation

## 2021-01-20 NOTE — Progress Notes (Signed)
Labor Progress Note  Kristen Jefferson is a 35 y.o. G1P0 at [redacted]w[redacted]d by 24 week ultrasound admitted for induction of labor due to Gestational diabetes and cHTN.  Subjective: Patient is sitting up in bed. She reports feeling the contractions, rating her discomfort at 6/10. She reports increased pelvic pressure.   Objective: BP 130/86 (BP Location: Right Arm)   Pulse 95   Temp 98.7 F (37.1 C) (Axillary)   Resp 16   Ht 5\' 4"  (1.626 m)   Wt 93.4 kg   LMP 07/03/2020   BMI 35.36 kg/m  Notable VS details: wnl  Fetal Assessment: FHT:  FHR: 145 bpm, variability: moderate,  accelerations:  Present,  decelerations:  Absent Category/reactivity:  Category I UC:   regular, every 2-4 minutes SVE:   5/90/-1 Membrane status: Intact Amniotic color: n/a  Labs: Lab Results  Component Value Date   WBC 13.1 (H) 01/20/2021   HGB 12.3 01/20/2021   HCT 37.0 01/20/2021   MCV 84.5 01/20/2021   PLT 181 01/20/2021    Assessment / Plan: Induction of labor due to gestational diabetes and cHTN,  progressing on pitocin  Labor: Progressing on Pitocin, will continue to increase then AROM - discussed risks vs benefits of AROM with patient at this time. She currently is declining AROM and desires epidural placement first. She is currently not ready to get her epidural. Explained process for IOL and AROM as a toll to expedite delivery. Preeclampsia:  No S&S of PIH  Fetal Wellbeing:  Category I Pain Control:  Labor support without medications  - patient is planning to get epidural but is not ready to at this time. I/D:  GBS negative; Covid-19 negative Anticipated MOD:  NSVD  03/22/2021, CNM 01/20/2021, 7:52 PM

## 2021-01-20 NOTE — Progress Notes (Addendum)
Labor Progress Note  Kristen Jefferson is a 35 y.o. G1P0 at [redacted]w[redacted]d by 24 week ultrasound admitted for induction of labor due to Gestational diabetes and Hypertension.  Subjective:  Patient is resting comfortably in bed. Denies any sensation of cramping or contractions at this time.  Objective: BP 133/70 (BP Location: Right Arm)   Pulse 98   Temp 97.8 F (36.6 C) (Oral)   Resp 15   Ht 5\' 4"  (1.626 m)   Wt 93.4 kg   LMP 07/03/2020   BMI 35.36 kg/m   Fetal Assessment: FHT:  FHR: 155 bpm, variability: moderate,  accelerations:  Present,  decelerations:  Absent Category/reactivity:  Category I UC:   Uterine irritability noted SVE:  Deferred at this time Membrane status: Intact Amniotic color: n/a  Labs: Lab Results  Component Value Date   WBC 13.1 (H) 01/20/2021   HGB 12.3 01/20/2021   HCT 37.0 01/20/2021   MCV 84.5 01/20/2021   PLT 181 01/20/2021    Assessment / Plan: Induction of labor due to gestational diabetes and cHTN, s/p one dose of cytotec for cervical ripening Patient noted to have elevated blood glucose on admission BMP, she had just eaten breakfast prior to this lab draw. Two hours after initial lab, her POC blood glucose was elevated. Findings reviewed with Dr. 03/22/2021 via phone, plan made to initiate endotool for glucose control during labor.   Labor: Cervical ripening - will reassess at 1315, plan to initiate pitocin when clinically indicated Preeclampsia:  No current S&S of PIH Fetal Wellbeing:  Category I Pain Control:  Labor support without medications I/D:  GBS negative; Covid-19 negative Anticipated MOD:  NSVD  Tiburcio Pea, CNM 01/20/2021, 11:00 AM

## 2021-01-20 NOTE — Progress Notes (Addendum)
ADA Standards of Care 2022 Diabetes in Pregnancy Target Glucose Ranges:  Fasting: 60 - 90 mg/dL Preprandial: 60 - 005 mg/dL 1 hr postprandial: Less than 140mg /dL (from first bite of meal) 2 hr postprandial: Less than 120 mg/dL (from first bit of meal) Review of Glycemic Control Results for Kristen Jefferson, Kristen Jefferson (MRN [name]) as of 01/20/2021 11:19  Ref. Range 01/20/2021 10:23  Glucose-Capillary Latest Ref Range: 70 - 99 mg/dL 03/22/2021 (H)  History of GDM- No medications listed Inpatient Diabetes Program Recommendations:    Agree with use of IV insulin/Endotool during labor and delivery.  Will follow.   Thanks,  567, RN, BC-ADM Inpatient Diabetes Coordinator Pager [phone]  (8a-5p)

## 2021-01-20 NOTE — H&P (Signed)
Obstetric H&P   Chief Complaint: Present for Induction of Labor  Prenatal Care Provider: [facility]  History of Present Illness: 35 y.o. G1P0 [redacted]w[redacted]d by 01/27/2021, by 24 week Ultrasound presenting to L&D for induction of labor due to history of chronic hypertension, not currently on anti-hypertensive therapy, and A1 GDM. The patient's pregnancy has been complicated by late entry to care, history of irregular heartbeat (negative work-up with cardiology during this pregnancy), and obesity. This morning patient denies any additional concerns. Reports +FM, denies VB, ctx, or LOF.  Pregravid weight 88.9 kg Total Weight Gain 4.082 kg  pregnancy1 Problems (from 07/03/20 to present)    Problem Noted Resolved   Encounter for induction of labor 01/20/2021 by Zipporah Plants, CNM No   Advanced maternal age, primigravida 10/08/2020 by Vena Austria, MD No   Benign essential hypertension, antepartum 10/08/2020 by Vena Austria, MD No   Previous Version   Irregular periods 09/21/2020 by Zipporah Plants, CNM No   History of irregular heartbeat 09/21/2020 by Zipporah Plants, CNM No   Supervision of high risk pregnancy, antepartum 09/21/2020 by Zipporah Plants, CNM No   Overview Addendum 01/18/2021  3:20 PM by Zipporah Plants, CNM    [facility]inic [facility] Prenatal Labs  Dating 24 week [place] Blood type: A pos  Genetic Screen NIPS: Normal XY Inheritest: negative CF, Fragile-X, and SMA Antibody: negative  Anatomic [place] Normal 10/08/2020 Rubella: Non-Immune Varicella: Immune  GTT Early 156 RPR: NR  Rhogam N/A HBsAg: negative  TDaP vaccine                       Flu Shot: HIV: negative  Baby Food  breast                        GBS:   Contraception  IUD Pap: 09/21/2020 ASCUS HPV negative  CBB   Trichomonas + NOB  CS/VBAC    Support Person  Robert    HTN - amlodipine discontinued per cards GDM  AMA Late prenatal care      Previous Version   Obesity (BMI 30-39.9) 09/21/2020 by Zipporah Plants, CNM No   Previous  Version   Chronic hypertension 09/21/2020 by Zipporah Plants, CNM 10/08/2020 by Vena Austria, MD       Review of Systems: 10 point review of systems negative unless otherwise noted in HPI  Past Medical History: Patient Active Problem List   Diagnosis Date Noted  . Labor and delivery, indication for care 01/20/2021  . Encounter for induction of labor 01/20/2021  . Gestational diabetes mellitus (GDM), antepartum 11/06/2020  . Abnormal glucose tolerance affecting pregnancy, antepartum 10/10/2020  . Advanced maternal age, primigravida 10/08/2020  . Benign essential hypertension, antepartum 10/08/2020  . Late prenatal care 10/08/2020  . Irregular periods 09/21/2020  . History of irregular heartbeat 09/21/2020  . Supervision of high risk pregnancy, antepartum 09/21/2020    [facility]inic [facility] Prenatal Labs  Dating 24 week [place] Blood type: A pos  Genetic Screen NIPS: Normal XY Inheritest: negative CF, Fragile-X, and SMA Antibody: negative  Anatomic [place] Normal 10/08/2020 Rubella: Non-Immune Varicella: Immune  GTT Early 156 RPR: NR  Rhogam N/A HBsAg: negative  TDaP vaccine                       Flu Shot: HIV: negative  Baby Food  breast  GBS:   Contraception  IUD Pap: 09/21/2020 ASCUS HPV negative  CBB   Trichomonas + NOB  CS/VBAC    Support Person  Robert    HTN - amlodipine discontinued per cards GDM  AMA Late prenatal care   . Obesity (BMI 30-39.9) 09/21/2020    Past Surgical History: Past Surgical History:  Procedure Laterality Date  . NO PAST SURGERIES      Past Obstetric History: # 1 - Date: None, Sex: None, Weight: None, GA: None, Delivery: None, Apgar1: None, Apgar5: None, Living: None, Birth Comments: None   Past Gynecologic History:  Family History: Family History  Problem Relation Age of Onset  . Cancer Mother   . Cancer Maternal Grandmother     Social History: Social History   Socioeconomic History  . Marital status: Married     Spouse name: Molly Maduro  . Number of children: Not on file  . Years of education: Not on file  . Highest education level: Not on file  Occupational History  . Not on file  Tobacco Use  . Smoking status: Former Smoker    Packs/day: 0.75    Years: 1.00    Pack years: 0.75    Quit date: 10/20/2013    Years since quitting: 7.2  . Smokeless tobacco: Never Used  Vaping Use  . Vaping Use: Never used  Substance and Sexual Activity  . Alcohol use: Never  . Drug use: Never  . Sexual activity: Yes    Birth control/protection: I.U.D.  Other Topics Concern  . Not on file  Social History Narrative  . Not on file   Social Determinants of Health   Financial Resource Strain: Not on file  Food Insecurity: Not on file  Transportation Needs: Not on file  Physical Activity: Not on file  Stress: Not on file  Social Connections: Not on file  Intimate Partner Violence: Not on file    Medications: Prior to Admission medications   Medication Sig Start Date End Date Taking? Authorizing Provider  Accu-Chek Softclix Lancets lancets 1 each by Other route 4 (four) times daily. 11/09/20  Yes Conard Novak, MD  aspirin EC 81 MG tablet Take 1 tablet (81 mg total) by mouth daily. Take after 12 weeks for prevention of preeclampssia later in pregnancy 10/08/20  Yes Vena Austria, MD  Blood Glucose Monitoring Suppl MISC Accucheck Guide me strips please.Patient has the monitor. 11/16/20  Yes Mirna Mires, CNM  glucose blood test strip UCheck blood glucose four times daily as directed (fasting, and 2-hr post breakfast, lunch, dinner) 11/09/20  Yes Conard Novak, MD  omeprazole (PRILOSEC) 40 MG capsule Take 40 mg by mouth daily. 08/03/20  Yes [provider]  Prenat w/o A Vit-FeFum-FePo-FA (CONCEPT OB) 130-92.4-1 MG CAPS Take 1 capsule by mouth daily. 11/09/20  Yes Conard Novak, MD  loratadine (CLARITIN) 10 MG tablet Take 10 mg by mouth daily as needed. Patient not taking: Reported on  01/20/2021 08/03/20   [provider]  meclizine (ANTIVERT) 25 MG tablet Take 25 mg by mouth 2 (two) times daily as needed. Patient not taking: Reported on 01/20/2021 08/23/20   [provider]    Allergies: No Known Allergies  Physical Exam: Vitals: Last menstrual period 07/03/2020, unknown if currently breastfeeding.  FHT: 155 bmp, moderate variability, +accels, -decels; Category I Toco: uterine irritability noted  General: NAD HEENT: normocephalic, anicteric Pulmonary: No increased work of breathing Cardiovascular: RRR, distal pulses 2+ Abdomen: Gravid, non-tender Leopolds: vtx; EFW: 7.5  lb Genitourinary: 2/70/-2/soft/mid/ vtx Extremities: no edema, erythema, or tenderness Neurologic: Grossly intact Psychiatric: mood appropriate, affect full  Labs: Results for orders placed or performed during the hospital encounter of 01/20/21 (from the past 24 hour(s))  CBC     Status: Abnormal   Collection Time: 01/20/21  8:25 AM  Result Value Ref Range   WBC 13.1 (H) 4.0 - 10.5 K/uL   RBC 4.38 3.87 - 5.11 MIL/uL   Hemoglobin 12.3 12.0 - 15.0 g/dL   HCT 09.6 28.3 - 66.2 %   MCV 84.5 80.0 - 100.0 fL   MCH 28.1 26.0 - 34.0 pg   MCHC 33.2 30.0 - 36.0 g/dL   RDW 94.7 (H) 65.4 - 65.0 %   Platelets 181 150 - 400 K/uL   nRBC 0.0 0.0 - 0.2 %    Assessment: 35 y.o. G1P0 [redacted]w[redacted]d by 01/27/2021, by 24 week Ultrasound for IOL due to cHTN, A1 GDM.  Plan: 1) Patient will undergo induction of labor with cervical ripening agents. Patient has been fully informed of the pros and cons, risks and benefits of continued observation with fetal monitoring versus that of induction of labor.   She understands that there are uncommon risks to induction, which include but are not limited to : frequent or prolonged uterine contractions, fetal distress, uterine rupture, and lack of successful induction.  These risks include all methods including Pitocin and Misoprostol.  Patient understands that using  Misoprostol for labor induction is an "off label" indication although it has been studied extensively for this purpose and is an accepted method of induction.  She also has been informed of the increased risks for Cesarean with induction and should induction not be successful.  Patient consents to the induction plan of management.  2) Fetus - Category I - will continue to monitor  3) PNL - Blood type A/Positive/-- (12/03 0922) / Anti-bodyscreen Negative (12/03 0922) / Rubella <0.90 (12/03 3546) / Varicella immune / RPR Non Reactive (02/04 1029) / HBsAg Negative (12/03 5681) / HIV Non Reactive (02/04 1029) / 1-hr OGTT 158/ 3-hr GTT 113/225/241/201 -all values elevated / GBS Negative/-- (03/18 1552)  4) Immunization History -  Immunization History  Administered Date(s) Administered  . Influenza-Unspecified 09/10/2020  . Tdap 12/07/2020    5) Disposition - Anticipate NSVD  Zipporah Plants, CNM, MSN [facility], [facility] 01/20/2021, 8:52 AM

## 2021-01-21 ENCOUNTER — Encounter
Admission: EM | Disposition: A | Discharge status: Home / Self Care | Payer: Self-pay | Source: Home / Self Care | Attending: Obstetrics & Gynecology

## 2021-01-21 ENCOUNTER — Encounter: Payer: Self-pay | Admitting: Obstetrics & Gynecology

## 2021-01-21 DIAGNOSIS — O24420 Gestational diabetes mellitus in childbirth, diet controlled: Secondary | ICD-10-CM

## 2021-01-21 DIAGNOSIS — Z3A39 39 weeks gestation of pregnancy: Secondary | ICD-10-CM

## 2021-01-21 LAB — GLUCOSE, CAPILLARY
Glucose-Capillary: 104 mg/dL — ABNORMAL HIGH (ref 70–99)
Glucose-Capillary: 105 mg/dL — ABNORMAL HIGH (ref 70–99)
Glucose-Capillary: 106 mg/dL — ABNORMAL HIGH (ref 70–99)
Glucose-Capillary: 106 mg/dL — ABNORMAL HIGH (ref 70–99)
Glucose-Capillary: 113 mg/dL — ABNORMAL HIGH (ref 70–99)
Glucose-Capillary: 116 mg/dL — ABNORMAL HIGH (ref 70–99)
Glucose-Capillary: 119 mg/dL — ABNORMAL HIGH (ref 70–99)
Glucose-Capillary: 128 mg/dL — ABNORMAL HIGH (ref 70–99)
Glucose-Capillary: 95 mg/dL (ref 70–99)
Glucose-Capillary: 98 mg/dL (ref 70–99)

## 2021-01-21 LAB — CBC
HCT: 28.3 % — ABNORMAL LOW (ref 36.0–46.0)
Hemoglobin: 9.5 g/dL — ABNORMAL LOW (ref 12.0–15.0)
MCH: 28.5 pg (ref 26.0–34.0)
MCHC: 33.6 g/dL (ref 30.0–36.0)
MCV: 85 fL (ref 80.0–100.0)
Platelets: 148 10*3/uL — ABNORMAL LOW (ref 150–400)
RBC: 3.33 MIL/uL — ABNORMAL LOW (ref 3.87–5.11)
RDW: 15.9 % — ABNORMAL HIGH (ref 11.5–15.5)
WBC: 16.4 10*3/uL — ABNORMAL HIGH (ref 4.0–10.5)
nRBC: 0 % (ref 0.0–0.2)

## 2021-01-21 LAB — HEMOGLOBIN A1C
Hgb A1c MFr Bld: 6.3 % — ABNORMAL HIGH (ref 4.8–5.6)
Mean Plasma Glucose: 134.11 mg/dL

## 2021-01-21 LAB — RPR: RPR Ser Ql: NONREACTIVE

## 2021-01-21 SURGERY — Surgical Case
Anesthesia: Epidural

## 2021-01-21 MED ORDER — FENTANYL CITRATE (PF) 100 MCG/2ML IJ SOLN
INTRAMUSCULAR | Status: AC
  Filled 2021-01-21: qty 2

## 2021-01-21 MED ORDER — CLINDAMYCIN PHOSPHATE 900 MG/50ML IV SOLN
INTRAVENOUS | Status: AC
  Administered 2021-01-21: 900 mg
  Filled 2021-01-21: qty 50

## 2021-01-21 MED ORDER — BISACODYL 10 MG RE SUPP
10.0000 mg | Freq: Every day | RECTAL | Status: DC | PRN
Start: 2021-01-21 — End: 2021-01-23
  Filled 2021-01-21: qty 1

## 2021-01-21 MED ORDER — DIPHENHYDRAMINE HCL 25 MG PO CAPS: 25.0000 mg | ORAL_CAPSULE | ORAL | Status: DC | PRN

## 2021-01-21 MED ORDER — DIPHENHYDRAMINE HCL 25 MG PO CAPS: 25.0000 mg | ORAL_CAPSULE | Freq: Four times a day (QID) | ORAL | Status: DC | PRN

## 2021-01-21 MED ORDER — LIDOCAINE HCL (PF) 2 % IJ SOLN
INTRAMUSCULAR | Status: DC | PRN
  Administered 2021-01-21 (×2): 100 mg via EPIDURAL
  Administered 2021-01-21 (×2): 60 mg via EPIDURAL
  Administered 2021-01-21 (×3): 100 mg via EPIDURAL

## 2021-01-21 MED ORDER — ONDANSETRON HCL 4 MG/2ML IJ SOLN: 4.0000 mg | Freq: Three times a day (TID) | INTRAMUSCULAR | Status: DC | PRN

## 2021-01-21 MED ORDER — SODIUM CHLORIDE 0.9 % IV SOLN
INTRAVENOUS | Status: DC | PRN
  Administered 2021-01-21: 50 ug/min via INTRAVENOUS

## 2021-01-21 MED ORDER — INSULIN ASPART 100 UNIT/ML ~~LOC~~ SOLN: 0.0000 [IU] | SUBCUTANEOUS | Status: DC

## 2021-01-21 MED ORDER — MENTHOL 3 MG MT LOZG
1.0000 | LOZENGE | OROMUCOSAL | Status: DC | PRN
  Filled 2021-01-21: qty 9

## 2021-01-21 MED ORDER — SIMETHICONE 80 MG PO CHEW
80.0000 mg | CHEWABLE_TABLET | Freq: Three times a day (TID) | ORAL | Status: DC
  Administered 2021-01-21 – 2021-01-23 (×3): 80 mg via ORAL
  Filled 2021-01-21 (×4): qty 1

## 2021-01-21 MED ORDER — DIPHENHYDRAMINE HCL 50 MG/ML IJ SOLN: 12.5000 mg | INTRAMUSCULAR | Status: DC | PRN

## 2021-01-21 MED ORDER — WITCH HAZEL-GLYCERIN EX PADS: 1.0000 "application " | MEDICATED_PAD | CUTANEOUS | Status: DC | PRN

## 2021-01-21 MED ORDER — LIDOCAINE HCL (PF) 2 % IJ SOLN
INTRAMUSCULAR | Status: AC
  Filled 2021-01-21: qty 20

## 2021-01-21 MED ORDER — LIDOCAINE HCL 2 % IJ SOLN
INTRAMUSCULAR | Status: AC
  Filled 2021-01-21: qty 10

## 2021-01-21 MED ORDER — FENTANYL CITRATE (PF) 100 MCG/2ML IJ SOLN
INTRAMUSCULAR | Status: DC | PRN
  Administered 2021-01-21: 100 ug via EPIDURAL

## 2021-01-21 MED ORDER — PRENATAL MULTIVITAMIN CH
1.0000 | ORAL_TABLET | Freq: Every day | ORAL | Status: DC
  Administered 2021-01-21 – 2021-01-23 (×3): 1 via ORAL
  Filled 2021-01-21 (×3): qty 1

## 2021-01-21 MED ORDER — CARBOPROST TROMETHAMINE 250 MCG/ML IM SOLN
INTRAMUSCULAR | Status: AC
  Filled 2021-01-21: qty 1

## 2021-01-21 MED ORDER — INSULIN ASPART 100 UNIT/ML ~~LOC~~ SOLN
0.0000 [IU] | Freq: Three times a day (TID) | SUBCUTANEOUS | Status: DC
  Administered 2021-01-21 – 2021-01-22 (×2): 1 [IU] via SUBCUTANEOUS
  Filled 2021-01-21 (×2): qty 1

## 2021-01-21 MED ORDER — ACETAMINOPHEN 500 MG PO TABS
1000.0000 mg | ORAL_TABLET | Freq: Four times a day (QID) | ORAL | Status: DC | PRN
  Administered 2021-01-21: 1000 mg via ORAL
  Filled 2021-01-21: qty 2

## 2021-01-21 MED ORDER — FERROUS SULFATE 325 (65 FE) MG PO TABS
325.0000 mg | ORAL_TABLET | Freq: Two times a day (BID) | ORAL | Status: DC
  Administered 2021-01-21 – 2021-01-23 (×4): 325 mg via ORAL
  Filled 2021-01-21 (×4): qty 1

## 2021-01-21 MED ORDER — FENTANYL CITRATE (PF) 100 MCG/2ML IJ SOLN: 25.0000 ug | INTRAMUSCULAR | Status: DC | PRN

## 2021-01-21 MED ORDER — NALBUPHINE HCL 10 MG/ML IJ SOLN: 5.0000 mg | INTRAMUSCULAR | Status: DC | PRN

## 2021-01-21 MED ORDER — PHENYLEPHRINE HCL (PRESSORS) 10 MG/ML IV SOLN
INTRAVENOUS | Status: DC | PRN
  Administered 2021-01-21 (×3): 100 ug via INTRAVENOUS

## 2021-01-21 MED ORDER — MORPHINE SULFATE (PF) 0.5 MG/ML IJ SOLN
INTRAMUSCULAR | Status: AC
  Filled 2021-01-21: qty 10

## 2021-01-21 MED ORDER — ACETAMINOPHEN 500 MG PO TABS
ORAL_TABLET | ORAL | Status: AC
  Administered 2021-01-21: 1000 mg via ORAL
  Filled 2021-01-21: qty 2

## 2021-01-21 MED ORDER — BUPIVACAINE ON-Q PAIN PUMP (FOR ORDER SET NO CHG)
INJECTION | Status: DC
  Filled 2021-01-21 (×3): qty 1

## 2021-01-21 MED ORDER — SODIUM CHLORIDE 0.9% FLUSH: 3.0000 mL | INTRAVENOUS | Status: DC | PRN

## 2021-01-21 MED ORDER — IBUPROFEN 800 MG PO TABS
800.0000 mg | ORAL_TABLET | Freq: Three times a day (TID) | ORAL | Status: DC
  Administered 2021-01-21 – 2021-01-23 (×5): 800 mg via ORAL
  Filled 2021-01-21 (×5): qty 1

## 2021-01-21 MED ORDER — METHYLERGONOVINE MALEATE 0.2 MG/ML IJ SOLN
INTRAMUSCULAR | Status: AC
  Filled 2021-01-21: qty 1

## 2021-01-21 MED ORDER — SODIUM CHLORIDE 0.9 % IV SOLN: 2.0000 g | Freq: Once | INTRAVENOUS | Status: AC

## 2021-01-21 MED ORDER — PIPERACILLIN-TAZOBACTAM 3.375 G IVPB
3.3750 g | Freq: Three times a day (TID) | INTRAVENOUS | Status: DC
  Administered 2021-01-21 – 2021-01-23 (×5): 3.375 g via INTRAVENOUS
  Filled 2021-01-21 (×8): qty 50

## 2021-01-21 MED ORDER — BUPIVACAINE 0.25 % ON-Q PUMP DUAL CATH 400 ML
400.0000 mL | INJECTION | Status: DC
  Filled 2021-01-21: qty 400

## 2021-01-21 MED ORDER — SENNOSIDES-DOCUSATE SODIUM 8.6-50 MG PO TABS
2.0000 | ORAL_TABLET | ORAL | Status: DC
  Administered 2021-01-21 – 2021-01-22 (×2): 2 via ORAL
  Filled 2021-01-21 (×2): qty 2

## 2021-01-21 MED ORDER — ONDANSETRON HCL 4 MG/2ML IJ SOLN
INTRAMUSCULAR | Status: DC | PRN
  Administered 2021-01-21: 4 mg via INTRAVENOUS

## 2021-01-21 MED ORDER — NALBUPHINE HCL 10 MG/ML IJ SOLN
5.0000 mg | Freq: Once | INTRAMUSCULAR | Status: DC | PRN
Start: 2021-01-21 — End: 2021-01-21

## 2021-01-21 MED ORDER — HYDROMORPHONE HCL 1 MG/ML IJ SOLN: 0.2000 mg | INTRAMUSCULAR | Status: DC | PRN

## 2021-01-21 MED ORDER — LACTATED RINGERS IV SOLN: INTRAVENOUS | Status: DC

## 2021-01-21 MED ORDER — BUPIVACAINE HCL (PF) 0.5 % IJ SOLN
INTRAMUSCULAR | Status: AC
  Filled 2021-01-21: qty 30

## 2021-01-21 MED ORDER — PROPOFOL 10 MG/ML IV BOLUS
INTRAVENOUS | Status: AC
  Filled 2021-01-21: qty 20

## 2021-01-21 MED ORDER — MEPERIDINE HCL 25 MG/ML IJ SOLN: 6.2500 mg | INTRAMUSCULAR | Status: DC | PRN

## 2021-01-21 MED ORDER — PIPERACILLIN-TAZOBACTAM 3.375 G IVPB 30 MIN: 3.3750 g | Freq: Three times a day (TID) | INTRAVENOUS | Status: DC

## 2021-01-21 MED ORDER — MEASLES, MUMPS & RUBELLA VAC IJ SOLR
0.5000 mL | Freq: Once | INTRAMUSCULAR | Status: AC
  Administered 2021-01-23: 0.5 mL via SUBCUTANEOUS
  Filled 2021-01-21 (×2): qty 0.5

## 2021-01-21 MED ORDER — NALOXONE HCL 4 MG/10ML IJ SOLN
1.0000 ug/kg/h | INTRAVENOUS | Status: DC | PRN
  Filled 2021-01-21: qty 5

## 2021-01-21 MED ORDER — KETOROLAC TROMETHAMINE 30 MG/ML IJ SOLN: 30.0000 mg | Freq: Four times a day (QID) | INTRAMUSCULAR | Status: DC

## 2021-01-21 MED ORDER — OXYCODONE HCL 5 MG PO TABS: 5.0000 mg | ORAL_TABLET | Freq: Once | ORAL | Status: DC | PRN

## 2021-01-21 MED ORDER — BUPIVACAINE HCL (PF) 0.5 % IJ SOLN
INTRAMUSCULAR | Status: DC | PRN
  Administered 2021-01-21: 30 mL

## 2021-01-21 MED ORDER — OXYCODONE HCL 5 MG PO TABS
5.0000 mg | ORAL_TABLET | Freq: Four times a day (QID) | ORAL | Status: AC | PRN
  Administered 2021-01-22: 5 mg via ORAL
  Filled 2021-01-21: qty 1

## 2021-01-21 MED ORDER — OXYCODONE HCL 5 MG/5ML PO SOLN
5.0000 mg | Freq: Once | ORAL | Status: DC | PRN
  Filled 2021-01-21: qty 5

## 2021-01-21 MED ORDER — SODIUM CHLORIDE 0.9 % IV SOLN
INTRAVENOUS | Status: AC
  Administered 2021-01-21: 2 g via INTRAVENOUS
  Filled 2021-01-21: qty 2000

## 2021-01-21 MED ORDER — MORPHINE SULFATE (PF) 0.5 MG/ML IJ SOLN
INTRAMUSCULAR | Status: DC | PRN
  Administered 2021-01-21: 3 mg via EPIDURAL

## 2021-01-21 MED ORDER — GENTAMICIN SULFATE 40 MG/ML IJ SOLN
1.5000 mg/kg | INTRAVENOUS | Status: AC
  Administered 2021-01-21 (×2): 140 mg via INTRAVENOUS
  Filled 2021-01-21: qty 3.5

## 2021-01-21 MED ORDER — OXYCODONE HCL 5 MG PO TABS: 5.0000 mg | ORAL_TABLET | ORAL | Status: DC | PRN

## 2021-01-21 MED ORDER — CLINDAMYCIN PHOSPHATE 900 MG/50ML IV SOLN: 900.0000 mg | Freq: Three times a day (TID) | INTRAVENOUS | Status: DC

## 2021-01-21 MED ORDER — ACETAMINOPHEN 325 MG PO TABS
650.0000 mg | ORAL_TABLET | Freq: Four times a day (QID) | ORAL | Status: AC
  Administered 2021-01-21 – 2021-01-22 (×3): 650 mg via ORAL
  Filled 2021-01-21 (×3): qty 2

## 2021-01-21 MED ORDER — OXYTOCIN-SODIUM CHLORIDE 30-0.9 UT/500ML-% IV SOLN: 2.5000 [IU]/h | INTRAVENOUS | Status: AC

## 2021-01-21 MED ORDER — NALBUPHINE HCL 10 MG/ML IJ SOLN: 5.0000 mg | Freq: Once | INTRAMUSCULAR | Status: DC | PRN

## 2021-01-21 MED ORDER — DIBUCAINE (PERIANAL) 1 % EX OINT: 1.0000 "application " | TOPICAL_OINTMENT | CUTANEOUS | Status: DC | PRN

## 2021-01-21 MED ORDER — COCONUT OIL OIL: 1.0000 "application " | TOPICAL_OIL | Status: DC | PRN

## 2021-01-21 MED ORDER — FLEET ENEMA 7-19 GM/118ML RE ENEM: 1.0000 | ENEMA | Freq: Every day | RECTAL | Status: DC | PRN

## 2021-01-21 MED ORDER — SIMETHICONE 80 MG PO CHEW
80.0000 mg | CHEWABLE_TABLET | ORAL | Status: DC | PRN
  Administered 2021-01-22: 80 mg via ORAL

## 2021-01-21 MED ORDER — NALOXONE HCL 0.4 MG/ML IJ SOLN: 0.4000 mg | INTRAMUSCULAR | Status: DC | PRN

## 2021-01-21 MED ORDER — ZOLPIDEM TARTRATE 5 MG PO TABS: 5.0000 mg | ORAL_TABLET | Freq: Every evening | ORAL | Status: DC | PRN

## 2021-01-21 MED ORDER — SOD CITRATE-CITRIC ACID 500-334 MG/5ML PO SOLN
30.0000 mL | Freq: Once | ORAL | Status: DC
  Filled 2021-01-21: qty 15

## 2021-01-21 SURGICAL SUPPLY — 32 items
CANISTER SUCT 3000ML PPV (MISCELLANEOUS) ×2 IMPLANT
CHLORAPREP W/TINT 26 (MISCELLANEOUS) ×4 IMPLANT
DERMABOND ADHESIVE PROPEN (GAUZE/BANDAGES/DRESSINGS) ×1
DERMABOND ADVANCED (GAUZE/BANDAGES/DRESSINGS) ×1
DERMABOND ADVANCED .7 DNX12 (GAUZE/BANDAGES/DRESSINGS) ×1 IMPLANT
DERMABOND ADVANCED .7 DNX6 (GAUZE/BANDAGES/DRESSINGS) ×1 IMPLANT
DRSG OPSITE POSTOP 4X10 (GAUZE/BANDAGES/DRESSINGS) ×2 IMPLANT
ELECT CAUTERY BLADE 6.4 (BLADE) ×2 IMPLANT
ELECT REM PT RETURN 9FT ADLT (ELECTROSURGICAL) ×2
ELECTRODE REM PT RTRN 9FT ADLT (ELECTROSURGICAL) ×1 IMPLANT
GLOVE SURG UNDER POLY LF SZ6.5 (GLOVE) ×4 IMPLANT
GOWN STRL REUS W/ TWL LRG LVL3 (GOWN DISPOSABLE) ×1 IMPLANT
GOWN STRL REUS W/ TWL XL LVL3 (GOWN DISPOSABLE) ×2 IMPLANT
GOWN STRL REUS W/TWL LRG LVL3 (GOWN DISPOSABLE) ×1
GOWN STRL REUS W/TWL XL LVL3 (GOWN DISPOSABLE) ×2
MANIFOLD NEPTUNE II (INSTRUMENTS) ×2 IMPLANT
MAT PREVALON FULL STRYKER (MISCELLANEOUS) ×2 IMPLANT
NS IRRIG 1000ML POUR BTL (IV SOLUTION) ×2 IMPLANT
PACK C SECTION AR (MISCELLANEOUS) ×2 IMPLANT
PAD OB MATERNITY 4.3X12.25 (PERSONAL CARE ITEMS) ×2 IMPLANT
PAD PREP 24X41 OB/GYN DISP (PERSONAL CARE ITEMS) ×2 IMPLANT
PENCIL SMOKE EVACUATOR (MISCELLANEOUS) ×2 IMPLANT
SUT MNCRL 4-0 (SUTURE) ×1
SUT MNCRL 4-0 27XMFL (SUTURE) ×1
SUT MNCRL AB 4-0 PS2 18 (SUTURE) ×2 IMPLANT
SUT PLAIN 3-0 (SUTURE) ×2 IMPLANT
SUT PROLENE 0 CT 1 30 (SUTURE) ×2 IMPLANT
SUT VIC AB 0 CT1 36 (SUTURE) ×6 IMPLANT
SUT VIC AB 2-0 CT1 36 (SUTURE) ×4 IMPLANT
SUT VICRYL+ 3-0 36IN CT-1 (SUTURE) ×6 IMPLANT
SUTURE MNCRL 4-0 27XMF (SUTURE) ×1 IMPLANT
SYR 30ML LL (SYRINGE) ×4 IMPLANT

## 2021-01-21 NOTE — Progress Notes (Signed)
Labor Progress Note  Kristen Jefferson is a 35 y.o. G1P0 at [redacted]w[redacted]d by 24 week ultrasound admitted for induction of labor due to Gestational diabetes and Hypertension.  Subjective:  Patient was pushing with bedside RN present. Reports strong pressure in pelvis with contractions.  Objective: BP 131/72   Pulse (!) 106   Temp (!) 100.4 F (38 C) (Axillary)   Resp 18   Ht 5\' 4"  (1.626 m)   Wt 93.4 kg   LMP 07/03/2020   SpO2 97%   BMI 35.36 kg/m   Fetal Assessment: FHT:  FHR: 155 bpm, variability: moderate,  accelerations:  Present,  decelerations:  Present intermittent variable decel Category/reactivity:  Category II UC:   regular, every 2.5-3 minutes SVE:   9.5 [left sided cervical lip]/100/0 Membrane status: AROM Amniotic color: clear  Labs: Lab Results  Component Value Date   WBC 13.1 (H) 01/20/2021   HGB 12.3 01/20/2021   HCT 37.0 01/20/2021   MCV 84.5 01/20/2021   PLT 181 01/20/2021    Assessment / Plan: Induction of labor due to gestational diabetes and cHTN,  progressing well on pitocin - persistent cervical lip on exam by CNM - hold pushing efforts for now, plan to re-evaluate in 30 mins- 1 hour.  Labor: Progressing s/p AROM and continue pitocin titration  Preeclampsia:  No S&S of PIH  GDM: Currently on Endotool - Insulin infusion at 1 unit/hour - most recent BG: 113 Fetal Wellbeing:  Category II Pain Control:  Epidural I/D:  GBS negative; Covid-19 negative Anticipated MOD:  NSVD  03/22/2021, CNM 01/21/2021, 7:04 AM

## 2021-01-21 NOTE — Progress Notes (Signed)
Labor Progress Note  Kristen Jefferson is a 35 y.o. G1P0 at [redacted]w[redacted]d by 24 week ultrasound admitted for induction of labor due to Gestational diabetes and Hypertension.  Subjective: Patient is resting comfortably in bed with epidural placed. Patient denies sensation of contractions at this time.  Objective: BP (!) 116/97   Pulse 95   Temp 97.9 F (36.6 C) (Axillary)   Resp 18   Ht 5\' 4"  (1.626 m)   Wt 93.4 kg   LMP 07/03/2020   SpO2 100%   BMI 35.36 kg/m  Notable VS details: wnl  Fetal Assessment: FHT:  FHR: 155 bpm, variability: moderate,  accelerations:  Present,  decelerations:  Present lates noted - resolved with position change Category/reactivity:  Category II UC:   regular, every 3-4 minutes SVE:  6/90/-2 Membrane status: AROM Amniotic color: clear  Labs: Lab Results  Component Value Date   WBC 13.1 (H) 01/20/2021   HGB 12.3 01/20/2021   HCT 37.0 01/20/2021   MCV 84.5 01/20/2021   PLT 181 01/20/2021    Assessment / Plan: Induction of labor due to gestational diabetes and cHTN,  progressing on pitocin, s/p AROM  Labor: IUPC placed, continue to titrate pitocin  Preeclampsia:  No S&S of PIH Fetal Wellbeing:  Category II Pain Control:  Epidural I/D:  GBS negative; Covid-19 negative Anticipated MOD:  NSVD  03/22/2021, CNM 01/21/2021, 4:14 AM

## 2021-01-21 NOTE — Progress Notes (Signed)
Inpatient Diabetes Program Recommendations  AACE/ADA: New Consensus Statement on Inpatient Glycemic Control  Target Ranges:  Prepandial:   less than 140 mg/dL      Peak postprandial:   less than 180 mg/dL (1-2 hours)      Critically ill patients:  140 - 180 mg/dL   Results for Kristen Jefferson, Kristen Jefferson (MRN [id number]) as of 01/21/2021 15:16  Ref. Range 01/21/2021 07:21 01/21/2021 09:32 01/21/2021 11:31 01/21/2021 13:30 01/21/2021 14:52  Glucose-Capillary Latest Ref Range: 70 - 99 mg/dL 242 (H) 353 (H) 614 (H) 104 (H) 98   Review of Glycemic Control  Diabetes history: GDM; [redacted]W[redacted]D Outpatient Diabetes medications: None Current orders for Inpatient glycemic control: IV insulin; transitioned to Novolog 0-15 units Q4H after delivery  Inpatient Diabetes Program Recommendations:    Insulin: Would recommend decreasing Novolog correction to 0-9 units Q4H and change to AC&HS when patient is ordered diet and eating.   NOTE: Noted consult for diabetes coordinator. Per chart, patient has no DM hx prior to developing GDM during pregnancy which was diet controlled.  Per office visit note on 01/18/21 by Daphene Calamity, CNM patient did not bring glucose log but reported fasting glucose 80-90 and postprandial around 120 mg/dl. Patient presented to hospital on 01/20/21 with initial glucose of 200 mg/dl and was placed on IV insulin. Patient had c-section around 13:20 today and IV insulin has been stopped and Novolog correction ordered. Anticipate glucose to normalize since patient has delivered. Would recommend decreasing Novolog correction to sensitive scale 0-9 units Q4H. Once diet is ordered please consider changing CBGs and Novolog to AC&HS. Will continue to follow along.  Thanks, Orlando Penner, RN, MSN, CDE Diabetes Coordinator Inpatient Diabetes Program [phone] (Team Pager from 8am to 5pm)

## 2021-01-21 NOTE — Progress Notes (Signed)
Labor Progress Note  Kristen Jefferson is a 35 y.o. G1P0 at [redacted]w[redacted]d by 24 week ultrasound admitted for induction of labor due to Gestational diabetes and Hypertension.  Subjective: Patient is resting comfortably after epidural placement. Denies sensation of contractions at this time.   Objective: BP (!) 116/97   Pulse 95   Temp 97.9 F (36.6 C) (Axillary)   Resp 18   Ht 5\' 4"  (1.626 m)   Wt 93.4 kg   LMP 07/03/2020   SpO2 100%   BMI 35.36 kg/m  Notable VS details: wnl  Fetal Assessment: FHT:  FHR: 155 bpm, variability: moderate,  accelerations:  Present,  decelerations:  Absent - baseline previously elevated - will continue to monitor closely Category/reactivity:  Category II UC:   regular, every 2-3 minutes SVE:   5.5/90/-2 Membrane status: AROM Amniotic color: clear/scant  Labs: Lab Results  Component Value Date   WBC 13.1 (H) 01/20/2021   HGB 12.3 01/20/2021   HCT 37.0 01/20/2021   MCV 84.5 01/20/2021   PLT 181 01/20/2021    Assessment / Plan: Induction of labor due to gestational diabetes and cHTN,  progressing well on pitocin - now s/p AROM  Labor: Progressing on pitocin, now s/p AROM Preeclampsia:  No S&S of PIH Fetal Wellbeing:  Category II Pain Control:  Epidural I/D:  GBS negative; Covid-19 negatvie Anticipated MOD:  NSVD  03/22/2021, CNM 01/21/2021, 23:45 PM       '

## 2021-01-21 NOTE — Lactation Note (Signed)
This note was copied from a baby's chart. Lactation Consultation Note  Patient Name: Kristen Jefferson [id number] Date: 01/21/2021 Reason for consult: L&D Initial assessment;Primapara;Term;Other (Comment) (GDM, c-section) Age:36 hours  LC and LC student at bedside in L&D for attempted feed. Mom desires breastfeeding. Baby delivered to a G1P1 via c-section 1.5 hours ago who was GDM, was given 69mL of formula post delivery, <1 hour ago. Blood sugar check post feed was 89.  Baby awake, somewhat fussy, feeding attempted but baby uninterested at the breast. LC taught hand expression to help encourage a deep latch, however baby did not latch. After a few attempts was placed skin to skin on mom's chest and settled quickly and fell asleep.  LC discussed importance of next feeding attempt to be at the breast and prior to offering formula in efforts to help mom meet her feeding goal of breast/breast milk. LC briefly discussed newborns stomach size, volume taken in, and impact this may have on her desire to eat for some time. Encouraged skin to skin, and rest.  Reviewed early cues, and when to call for the next feeding/feeding attempt. Parents verbalized understanding.  Maternal Data Has patient been taught Hand Expression?: Yes Does the patient have breastfeeding experience prior to this delivery?: No  Feeding Mother's Current Feeding Choice: Breast Milk Nipple Type: Slow - flow  LATCH Score Latch: Too sleepy or reluctant, no latch achieved, no sucking elicited.  Audible Swallowing: None  Type of Nipple: Flat  Comfort (Breast/Nipple): Soft / non-tender  Hold (Positioning): Assistance needed to correctly position infant at breast and maintain latch.  LATCH Score: 4   Lactation Tools Discussed/Used    Interventions Interventions: Breast feeding basics reviewed;Assisted with latch;Hand express;Adjust position;Support pillows;Position options;Education  Discharge    Consult  Status Consult Status: Follow-up Date: 01/22/21 Follow-up type: In-patient    Danford Bad 01/21/2021, 3:31 PM

## 2021-01-21 NOTE — Progress Notes (Signed)
RN attempted to assist patient to breastfeed. After providing education on latching infant, patient unable to attempt in assisting in latching the baby. Pt not responding to some questions asked by RN, very flat affect. After RN got infant latched, 6 minutes into feed, mom unlatched baby and stated, "shes not hungry anymore." Infant vigorously crying and cueing. Mother asked for bottle and stated she doesn't want to breastfeed anymore because infant takes to bottle better. Mother education on benefits of breastfeeding but she would still like to bottle feed instead. Formula and formula education given to parents.

## 2021-01-21 NOTE — Transfer of Care (Signed)
Immediate Anesthesia Transfer of Care Note  Patient: Kristen Jefferson  Procedure(s) Performed: CESAREAN SECTION  Patient Location: Mother/Baby  Anesthesia Type:Epidural  Level of Consciousness: awake, alert  and oriented  Airway & Oxygen Therapy: Patient Spontanous Breathing  Post-op Assessment: Report given to RN and Post -op Vital signs reviewed and stable  Post vital signs: Reviewed and stable  Last Vitals:  Vitals Value Taken Time  BP 124/77 01/21/21 1331  Temp 36.9 C 01/21/21 1331  Pulse 120 01/21/21 1331  Resp 18 01/21/21 1331  SpO2 99 % 01/21/21 1331    Last Pain:  Vitals:   01/21/21 1331  TempSrc: Oral  PainSc:       Patients Stated Pain Goal: 0 (01/20/21 1832)  Complications: No complications documented.

## 2021-01-21 NOTE — Progress Notes (Signed)
  Labor Progress Note   35 y.o. G1P0 @ [redacted]w[redacted]d , admitted for  Pregnancy, Labor Management.   Subjective:  Shaky and having emesis.  Objective:  BP 131/90 (BP Location: Right Arm)   Pulse 96   Temp 97.9 F (36.6 C) (Oral)   Resp 20   Ht 5\' 4"  (1.626 m)   Wt 93.4 kg   LMP 07/03/2020   SpO2 100%   BMI 35.36 kg/m  Abd: gravid, ND, FHT present, mild tenderness on exam Extr: no edema SVE: CERVIX: anterior lip cm dilated, 100 effaced, +1 station  EFM: FHR: 150 bpm, variability: moderate,  accelerations:  Present,  decelerations:  Absent Toco: Frequency: Every 2 minutes Labs: I have reviewed the patient's lab results.   Assessment & Plan:  G1P0 @ [redacted]w[redacted]d, admitted for  Pregnancy and Labor/Delivery Management  1. Pain management: epidural. 2. FWB: FHT category I.  3. ID: GBS negative 4. Labor management: labor down 5. Continue endo tool GDM insulin  All discussed with patient, see orders   [redacted]w[redacted]d, CNM [facility] 01/21/2021  9:46 AM

## 2021-01-21 NOTE — Op Note (Signed)
Cesarean Section Procedure Note 01/21/21  Pre-operative Diagnosis:  1. Arrest of decent  2. Prolonged Fetal Tachycardia  3. Thick meconium  4. Gestational diabetes  Post-operative Diagnosis: same, delivered.  1. Arrest of decent  2. Prolonged Fetal Tachycardia  3. Thick meconium  4. Gestational diabetes  Procedure: Primary Low Transverse with T uterine incision Cesarean Section  Surgeon: Adelene Idler MD  Assistant(s): Tresea Mall CNM - No other skilled surgical assistant available. Anesthesia: Epidural Estimated Blood Loss: 1430 cc Complications: None; patient tolerated the procedure well.   Disposition: PACU - hemodynamically stable. Condition: stable   Findings: A female infant in the cephalic presentation. Amniotic fluid - meconium Birth weight: 8 lbs 0.4oz Apgars of 8 and 8.  Intact placenta with a three-vessel cord. Grossly normal uterus, tubes and ovaries bilaterally. No intraabdominal adhesions were noted.   Procedure Details    The patient was taken to operating room, identified as the correct patient and the procedure verified as C-Section Delivery. A time out was held and the above information confirmed. After induction of anesthesia, the patient was draped and prepped in the usual sterile manner. A Pfannenstiel incision was made and carried down through the subcutaneous tissue to the fascia. Fascial incision was made and extended transversely with the Mayo scissors. The fascia was separated from the underlying rectus tissue superiorly and inferiorly. The peritoneum was identified and entered bluntly. Peritoneal incision was extended longitudinally. A low transverse hysterotomy was made. The infant was in the direct OP position and flexion of the head for delivery was challenging. Assistance from below with a sterile glove did not deliver the head. The uterine incision was extended vertically in a T fashion. I switched sides of the bed and was then able to deliver  the fetus in an atraumatic manner. The umbilical cord was clamped x2 and cut and the infant was handed to the awaiting pediatricians. The placenta was removed intact and appeared normal with a 3-vessel cord. The uterus was exteriorized and cleared of all clot and debris. The hysterotomy was closed with running sutures of 0 Vicryl suture. The vertical incision was closed with three layers of 0-vicryl and 2-0 vicryl suture. Excellent hemostasis was observed.  The uterus was returned to the abdomen. The pelvis was cleared and again, excellent hemostasis was noted. The peritoneum was closed with a running stitch of 2-0 Vicryl. The On Q Pain pump System was then placed.  Trocars were placed through the abdominal wall into the subfascial space and these were used to thread the silver soaker cathaters into place.The rectus muscles were inspected and were hemostatic. The rectus fascia was then reapproximated with running sutures of 0-vicryl, with careful placement not to incorporate the cathaters. Subcutaneous tissues are then irrigated with saline and hemostasis assured with the bovie. The subcutaneous fat was approximated with 3-0 plain and a running stitch.  The skin was closed with 4-0 monocryl suture in a subcuticular fashion followed by skin adhesive. The cathaters are flushed each with 5 mL of Bupivicaine and stabilized into place with dressing. Instrument, sponge, and needle counts were correct prior to the abdominal closure and at the conclusion of the case.  The patient tolerated the procedure well and was transferred to the recovery room in stable condition.   Natale Milch MD [facility], [facility] 01/21/21 1:19 PM

## 2021-01-21 NOTE — Progress Notes (Signed)
Called for prolonged fetal tachycardia and poor maternal pushing efforts.  SVE: complete, 1000, +2 station, meconium seen.  Discussed option for attempting VAVD versus cesarean.  Patient desired to try VAVD. Vacuum applied, 1 pulled attempted. Patient writhing in pain, not able to assist with pushing efforts, no decent of fetal head. Vacuum released and decision made to proceed with delivery by cesarean section.Mom receiving antibiotics with Ampicillin, clindamycin, and gentamycin.   Urgent cesarean section called.   Adelene Idler MD, [facility], [facility] 01/21/2021 11:39 AM

## 2021-01-22 ENCOUNTER — Encounter: Payer: Self-pay | Admitting: Obstetrics and Gynecology

## 2021-01-22 LAB — GLUCOSE, CAPILLARY
Glucose-Capillary: 98 mg/dL (ref 70–99)
Glucose-Capillary: 89 mg/dL (ref 70–99)
Glucose-Capillary: 102 mg/dL — ABNORMAL HIGH (ref 70–99)
Glucose-Capillary: 147 mg/dL — ABNORMAL HIGH (ref 70–99)

## 2021-01-22 LAB — CBC
HCT: 24.5 % — ABNORMAL LOW (ref 36.0–46.0)
Hemoglobin: 8.1 g/dL — ABNORMAL LOW (ref 12.0–15.0)
MCH: 28.3 pg (ref 26.0–34.0)
MCHC: 33.1 g/dL (ref 30.0–36.0)
MCV: 85.7 fL (ref 80.0–100.0)
Platelets: 143 10*3/uL — ABNORMAL LOW (ref 150–400)
RBC: 2.86 MIL/uL — ABNORMAL LOW (ref 3.87–5.11)
RDW: 16.1 % — ABNORMAL HIGH (ref 11.5–15.5)
WBC: 12.3 10*3/uL — ABNORMAL HIGH (ref 4.0–10.5)
nRBC: 0 % (ref 0.0–0.2)

## 2021-01-22 MED ORDER — ACETAMINOPHEN 325 MG PO TABS
650.0000 mg | ORAL_TABLET | Freq: Four times a day (QID) | ORAL | Status: DC | PRN
  Administered 2021-01-22 – 2021-01-23 (×2): 650 mg via ORAL
  Filled 2021-01-22 (×2): qty 2

## 2021-01-22 NOTE — Progress Notes (Signed)
Inpatient Diabetes Program Recommendations  AACE/ADA: New Consensus Statement on Inpatient Glycemic Control   Target Ranges:  Prepandial:   less than 140 mg/dL      Peak postprandial:   less than 180 mg/dL (1-2 hours)      Critically ill patients:  140 - 180 mg/dL  Results for Kristen Jefferson, Kristen Jefferson (MRN [id number]) as of 01/22/2021 08:47  Ref. Range 01/22/2021 08:23  Glucose-Capillary Latest Ref Range: 70 - 99 mg/dL 98   Results for Kristen Jefferson, Kristen Jefferson (MRN [id number]) as of 01/22/2021 07:13  Ref. Range 01/21/2021 07:21 01/21/2021 09:32 01/21/2021 11:31 01/21/2021 13:30 01/21/2021 14:52 01/21/2021 17:46 01/21/2021 21:11  Glucose-Capillary Latest Ref Range: 70 - 99 mg/dL 732 (H) 202 (H) 542 (H) 104 (H) 98 95 128 (H)  Results for Kristen Jefferson, Kristen Jefferson (MRN [id number]) as of 01/22/2021 07:13  Ref. Range 01/21/2021 14:19  Hemoglobin A1C Latest Ref Range: 4.8 - 5.6 % 6.3 (H)   Review of Glycemic Control  Diabetes history: GDM; delivered 01/21/21 Outpatient Diabetes medications: None Current orders for Inpatient glycemic control: Novolog 0-9 units AC&HS  Inpatient Diabetes Program Recommendations:    Outpatient glycemic control: Would recommend patient follow up with PCP regarding glycemic control and have A1C repeated in 3 months.  NOTE: Noted consult for diabetes coordinator. Per chart, patient has no DM hx prior to developing GDM during pregnancy which was diet controlled.  Per office visit note on 01/18/21 by Daphene Calamity, CNM patient did not bring glucose log but reported fasting glucose 80-90 and postprandial around 120 mg/dl. Patient presented to hospital on 01/20/21 with initial glucose of 200 mg/dl and was placed on IV insulin. Patient had c-section around 13:20 today and IV insulin has been stopped and Novolog correction ordered.   Thanks, Orlando Penner, RN, MSN, CDE Diabetes Coordinator Inpatient Diabetes Program [phone] (Team Pager from 8am to 5pm)

## 2021-01-22 NOTE — Anesthesia Postprocedure Evaluation (Signed)
Anesthesia Post Note  Patient: Kristen Jefferson  Procedure(s) Performed: CESAREAN SECTION  Patient location during evaluation: Mother Baby Anesthesia Type: Epidural Level of consciousness: awake and alert Pain management: pain level controlled Vital Signs Assessment: post-procedure vital signs reviewed and stable Respiratory status: spontaneous breathing, nonlabored ventilation and respiratory function stable Cardiovascular status: stable Postop Assessment: no headache, no backache and epidural receding Anesthetic complications: no   No complications documented.   Last Vitals:  Vitals:   01/22/21 0400 01/22/21 0500  BP:    Pulse:    Resp:    Temp:    SpO2: 99% 97%    Last Pain:  Vitals:   01/22/21 0300  TempSrc: Oral  PainSc:                  Rosanne Gutting

## 2021-01-22 NOTE — Progress Notes (Signed)
Obstetric Postpartum/PostOperative Daily Progress Note Subjective:  35 y.o. G1P1001 post-operative day # 1 status post primary cesarean delivery.  She is ambulating, is tolerating po, is voiding spontaneously.  Her pain is well controlled on PO pain medications. Her lochia is less than menses. Patient is pleasant, resting in bed this morning. She denies any additional concerns.   Medications SCHEDULED MEDICATIONS  . ferrous sulfate  325 mg Oral BID WC  . ibuprofen  800 mg Oral Q8H  . insulin aspart  0-9 Units Subcutaneous TID AC & HS  . measles, mumps & rubella vaccine  0.5 mL Subcutaneous Once  . prenatal multivitamin  1 tablet Oral Q1200  . senna-docusate  2 tablet Oral Q24H  . simethicone  80 mg Oral TID PC    MEDICATION INFUSIONS  . bupivacaine 0.25 % ON-Q pump DUAL CATH 400 mL    . lactated ringers Stopped (01/22/21 0400)  . naLOXone [facility]) adult infusion for PRURITIS    . piperacillin-tazobactam (ZOSYN)  IV 3.375 g (01/22/21 0505)    PRN MEDICATIONS  bisacodyl, coconut oil, witch hazel-glycerin **AND** dibucaine, diphenhydrAMINE, HYDROmorphone (DILAUDID) injection, menthol-cetylpyridinium, naloxone **AND** sodium chloride flush, naLOXone (NARCAN) adult infusion for PRURITIS, ondansetron (ZOFRAN) IV, oxyCODONE, simethicone, sodium phosphate, zolpidem    Objective:   Vitals:   01/22/21 0400 01/22/21 0500 01/22/21 0748 01/22/21 0822  BP:    (!) 102/58  Pulse:    92  Resp:    18  Temp:    97.7 F (36.5 C)  TempSrc:    Oral  SpO2: 99% 97% 98% 99%  Weight:      Height:        Current Vital Signs 24h Vital Sign Ranges  T 97.7 F (36.5 C) Temp  Avg: 99.3 F (37.4 C)  Min: 97.7 F (36.5 C)  Max: 103.2 F (39.6 C)  BP (!) 102/58 BP  Min: 91/65  Max: 152/135  HR 92 Pulse  Avg: 136.2  Min: 92  Max: 263  RR 18 Resp  Avg: 18.9  Min: 11  Max: 28  SaO2 99 %  (room air) SpO2  Avg: 97 %  Min: 96 %  Max: 99 %       24 Hour I/O Current Shift I/O  Time Ins Outs 04/04 0701 -  04/05 0700 In: 1605.6 [I.V.:1500.3] Out: 3610 [Urine:2120] No intake/output data recorded.  General: NAD Pulmonary: no increased work of breathing Abdomen: non-distended, non-tender, fundus firm at level of umbilicus Inc: Clean/dry/intact Extremities: no edema, no erythema, no tenderness  Labs:  Recent Labs  Lab 01/20/21 0825 01/21/21 1709 01/22/21 0554  WBC 13.1* 16.4* 12.3*  HGB 12.3 9.5* 8.1*  HCT 37.0 28.3* 24.5*  PLT 181 148* 143*     Assessment:   34 y.o. G1P1001 postoperative day # 1 status post primary cesarean section  Plan:  1) Acute blood loss anemia - hemodynamically stable and asymptomatic - po ferrous sulfate  2) A POS Performed at [facility], [address] [place], [place] [address]  / Ishmael Holter <0.90 (12/03 0922)/ Varicella Immune  3) TDAP status UTD - 12/07/20  4) breast and bottle /Contraception = IUD  5) Disposition- Continue current care, assess for discharge status tomorrow  Zipporah Plants, [place] 01/22/2021 9:50 AM

## 2021-01-22 NOTE — Anesthesia Post-op Follow-up Note (Signed)
  Anesthesia Pain Follow-up Note  Patient: Kristen Jefferson  Day #: 1  Date of Follow-up: 01/22/2021 Time: 7:45 AM  Last Vitals:  Vitals:   01/22/21 0400 01/22/21 0500  BP:    Pulse:    Resp:    Temp:    SpO2: 99% 97%    Level of Consciousness: alert  Pain: none   Side Effects:None  Catheter Site Exam:clean, dry, no drainage     Plan: D/C from anesthesia care at surgeon's request  Rosanne Gutting

## 2021-01-22 NOTE — Lactation Note (Signed)
This note was copied from a baby's chart. Lactation Consultation Note  Patient Name: Kristen Jefferson [id number] Date: 01/22/2021 Reason for consult: Follow-up assessment Age:36 hours   LC completed follow visit today. Mom, infant and dad were present. LC noticed infant sleep in moms arms and opened formula bottle on table. LC asked how the evening went and feeding plan. Mother and father both stated they wanted to breastfeed but infant already taking to bottle well. Per review of previous LC note, formula was already provided from nursing staff with no follow up note of reason provided. [facility] student then asked about last feeding, mom said it was formula at 850am and about 30-30ml. During visit mom reported trying to feed infant again (right before LC walked in), [facility] student educated mom on infant stomach size and pace bottle feeding.   Family strongly desires to be assisted with breastfeeding by lactation, although formula bottles were provided through the evening. LC will continue to support.   Maternal Data Does the patient have breastfeeding experience prior to this delivery?: No  Feeding Mother's Current Feeding Choice: Formula     Interventions    Discharge WIC Program: Yes  Consult Status  Follow up inpatient     Aryani Daffern-Lactation Student  01/22/2021, 11:41 AM

## 2021-01-23 LAB — CBC WITH DIFFERENTIAL/PLATELET
Abs Immature Granulocytes: 0.05 10*3/uL (ref 0.00–0.07)
Basophils Absolute: 0 10*3/uL (ref 0.0–0.1)
Basophils Relative: 0 %
Eosinophils Absolute: 0 10*3/uL (ref 0.0–0.5)
Eosinophils Relative: 0 %
HCT: 23.1 % — ABNORMAL LOW (ref 36.0–46.0)
Hemoglobin: 7.5 g/dL — ABNORMAL LOW (ref 12.0–15.0)
Immature Granulocytes: 1 %
Lymphocytes Relative: 12 %
Lymphs Abs: 1.2 10*3/uL (ref 0.7–4.0)
MCH: 28.3 pg (ref 26.0–34.0)
MCHC: 32.5 g/dL (ref 30.0–36.0)
MCV: 87.2 fL (ref 80.0–100.0)
Monocytes Absolute: 0.5 10*3/uL (ref 0.1–1.0)
Monocytes Relative: 5 %
Neutro Abs: 8 10*3/uL — ABNORMAL HIGH (ref 1.7–7.7)
Neutrophils Relative %: 82 %
Platelets: 154 10*3/uL (ref 150–400)
RBC: 2.65 MIL/uL — ABNORMAL LOW (ref 3.87–5.11)
RDW: 16.3 % — ABNORMAL HIGH (ref 11.5–15.5)
WBC: 9.7 10*3/uL (ref 4.0–10.5)
nRBC: 0 % (ref 0.0–0.2)

## 2021-01-23 LAB — GLUCOSE, CAPILLARY: Glucose-Capillary: 90 mg/dL (ref 70–99)

## 2021-01-23 MED ORDER — IBUPROFEN 800 MG PO TABS: 800.0000 mg | ORAL_TABLET | Freq: Three times a day (TID) | ORAL | 0 refills | Status: DC

## 2021-01-23 MED ORDER — OXYCODONE HCL 5 MG PO TABS: 5.0000 mg | ORAL_TABLET | ORAL | 0 refills | Status: DC | PRN

## 2021-01-23 NOTE — Lactation Note (Signed)
This note was copied from a baby's chart. Lactation Consultation Note  Patient Name: Kristen Jefferson [id number]'[id number] Date: 01/23/2021 Reason for consult: Follow-up assessment;Term;Other (Comment) (c-section) Age:36 hours  Lactation follow-up. Mom initially desired to BF, however changed to formula/bottle feeding. Mom declined pump setup and education.  Baby tolerating bottle feeds well. LC reviewed stomach size, appropriate volumes, feeding with cues, and paced bottle feeding.  LC discussed with mom ways to dry up her supply since she was initially stimulated/BF day 1 attempts. Cabbage, supportive bra, no stimulation, ice packs, tylenol/motrin as needed.  [facility] referral sent with consent from parents.  Encouraged to call with questions/concerns.  Maternal Data Has patient been taught Hand Expression?: Yes Does the patient have breastfeeding experience prior to this delivery?: No  Feeding Mother's Current Feeding Choice: Formula  LATCH Score                    Lactation Tools Discussed/Used Tools: Bottle  Interventions    Discharge Discharge Education: Other (comment) (drying up her milk supply) WIC Program: Yes  Consult Status Consult Status: Complete Date: 01/23/21 Follow-up type: Call as needed    Danford Bad 01/23/2021, 10:55 AM

## 2021-01-23 NOTE — Progress Notes (Signed)
Patient discharged home with infant. Discharge instructions, prescriptions, and follow-up appointment were given to and reviewed with patient. Patient verbalized understanding. Escorted out by auxiliary.  

## 2021-01-23 NOTE — TOC Initial Note (Addendum)
Transition of Care [facility]) - Initial/Assessment Note    Patient Details  Name: Kristen Jefferson MRN: [id number] Date of Birth: 1986/10/01  Transition of Care [facility]) CM/SW Contact:    [place] Cellar, RN Phone Number: 01/23/2021, 12:26 PM  Clinical Narrative:                 Spoke with mother and FOB at bedside. Mother reports she is feeling good and eager to discharge home. FOB reports he has taken this week off of work to assist patient and has every Friday off work to take patient to appointments. Patient does not have transportation however FOB or his sister are able to assist with transportation. Patient has selected pediatrician and has appointment made. Aware of potential medical concerns with infant and importance of good follow up and work up. Parents report they have WIC and all needed equipment for infant. FOB is going to get car seat from car for nurse check. MOB is requesting bottlefeed only and has requested a few extra bottles to last until [facility] services are available for formula. Discussed PPD with patient and FOB and how to seek help if needed. Discussed importance of no smoking around infant and good hand hygiene when handling infant. Patient reports she did not know she was pregnant until 5 months into pregnancy and feels like she is still adjusting to the idea although she is excited. Answered all questions for both parents and no additional needs identified.          Patient Goals and CMS Choice        Expected Discharge Plan and Services           Expected Discharge Date: 01/23/21                                    Prior Living Arrangements/Services                       Activities of Daily Living Home Assistive Devices/Equipment: None ADL Screening (condition at time of admission) Patient's cognitive ability adequate to safely complete daily activities?: Yes Is the patient deaf or have difficulty hearing?: No Does the patient have difficulty  seeing, even when wearing glasses/contacts?: No Does the patient have difficulty concentrating, remembering, or making decisions?: No Patient able to express need for assistance with ADLs?: Yes Does the patient have difficulty dressing or bathing?: No Independently performs ADLs?: Yes (appropriate for developmental age) Does the patient have difficulty walking or climbing stairs?: No Weakness of Legs: None Weakness of Arms/Hands: None  Permission Sought/Granted                  Emotional Assessment              Admission diagnosis:  Labor and delivery, indication for care [O75.9] Patient Active Problem List   Diagnosis Date Noted  . Postpartum care following cesarean delivery 01/23/2021  . Delivery of first pregnancy by cesarean section using T-shaped incision 01/21/2021  . Arrest of descent, delivered, current hospitalization   . Labor and delivery, indication for care 01/20/2021  . Encounter for induction of labor 01/20/2021  . Gestational diabetes mellitus (GDM), antepartum 11/06/2020  . Abnormal glucose tolerance affecting pregnancy, antepartum 10/10/2020  . Advanced maternal age, primigravida 10/08/2020  . Benign essential hypertension, antepartum 10/08/2020  . Late prenatal care 10/08/2020  . Irregular periods 09/21/2020  .  History of irregular heartbeat 09/21/2020  . Supervision of high risk pregnancy, antepartum 09/21/2020  . Obesity (BMI 30-39.9) 09/21/2020   PCP:  Zipporah Plants, CNM Pharmacy:   [id number] [place], [place] - [address] [place] [address] Phone: [phone] Fax: [phone]     Social Determinants of Health (SDOH) Interventions    Readmission Risk Interventions No flowsheet data found.

## 2021-01-23 NOTE — Discharge Instructions (Signed)

## 2021-01-23 NOTE — Discharge Summary (Signed)
Postpartum Discharge Summary  Date of Service updated 01/23/2021     Patient Name: Kristen Jefferson DOB: [birth year] MRN: [id number]  Date of admission: 01/20/2021 Delivery date:01/21/2021  Delivering provider: Adrian Prows R  Date of discharge: 01/23/2021  Admitting diagnosis: Labor and delivery, indication for care [O75.9] Intrauterine pregnancy: [redacted]w[redacted]d    Secondary diagnosis:  Active Problems:   Benign essential hypertension, antepartum   Gestational diabetes mellitus (GDM), antepartum   Labor and delivery, indication for care   Encounter for induction of labor   Delivery of first pregnancy by cesarean section using T-shaped incision   Arrest of descent, delivered, current hospitalization   Postpartum care following cesarean delivery  Additional problems: acute blood loss anemia    Discharge diagnosis: Term Pregnancy Delivered , S/P primary Cesarean section                                            Post partum procedures:NA Augmentation: AROM and Pitocin Complications: Intrauterine Inflammation or infection (Chorioamniotis) and Hemorrhage>10058m Hospital course: Onset of Labor With Unplanned C/S   [age].o. yo G1P1001 at 3925w1ds admitted for scheduled induction of labor on 01/20/2021. Patient had a labor course significant for protracted active phase and arrest of second stage labor.. The patient went for cesarean section due to Arrest of Descent, Non-Reassuring FHR and meconium fluid. Delivery details as follows: Membrane Rupture Time/Date: 11:42 PM ,01/20/2021   Delivery Method:C-Section, Low Transverse  Details of operation can be found in separate operative note. Patient had an uncomplicated postpartum course.  She is ambulating,tolerating a regular diet, passing flatus, and urinating well.  Patient is discharged home in stable condition 01/23/21.  Newborn Data: Birth date:01/21/2021  Birth time:12:16 PM  Gender:Female  Living status:Living  Apgars:8 ,8  Weight:3640  g   Magnesium Sulfate received: No BMZ received: No Rhophylac:N/A MMR:Yes T-DaP:Given prenatally Flu: Yes Transfusion:No  Physical exam  Vitals:   01/22/21 1936 01/22/21 2312 01/23/21 0326 01/23/21 0813  BP: 110/66 128/80 122/88 122/81  Pulse: (!) 105 (!) 102 93   Resp: _0 Temp: 98.2 F (36.8 C) 98.5 F (36.9 C) 98.7 F (37.1 C) 98 F (36.7 C)  TempSrc: Oral Oral Oral   SpO2: 97% 99% 100% 99%  Weight:      Height:       General: alert, cooperative and no distress Lochia: appropriate Uterine Fundus: firm Incision: Healing well with no significant drainage, Dressing is clean, dry, and intact DVT Evaluation: No evidence of DVT seen on physical exam. Negative Homan's sign. Labs: Lab Results  Component Value Date   WBC 9.7 01/23/2021   HGB 7.5 (L) 01/23/2021   HCT 23.1 (L) 01/23/2021   MCV 87.2 01/23/2021   PLT 154 01/23/2021   CMP Latest Ref Rng & Units 01/20/2021  Glucose 70 - 99 mg/dL 200(H)  BUN 6 - 20 mg/dL 13  Creatinine 0.44 - 1.00 mg/dL 0.61  Sodium 135 - 145 mmol/L 133(L)  Potassium 3.5 - 5.1 mmol/L 3.6  Chloride 98 - 111 mmol/L 104  CO2 22 - 32 mmol/L 18(L)  Calcium 8.9 - 10.3 mg/dL 8.7(L)  Total Protein 6.0 - 8.5 g/dL -  Total Bilirubin 0.0 - 1.2 mg/dL -  Alkaline Phos 44 - 121 IU/L -  AST 0 - 40 IU/L -  ALT 0 - 32 IU/L -  Edinburgh Score: Edinburgh Postnatal Depression Scale Screening Tool 01/22/2021  I have been able to laugh and see the funny side of things. 0  I have looked forward with enjoyment to things. 0  I have blamed myself unnecessarily when things went wrong. 0  I have been anxious or worried for no good reason. 0  I have felt scared or panicky for no good reason. 0  Things have been getting on top of me. 0  I have been so unhappy that I have had difficulty sleeping. 0  I have felt sad or miserable. 0  I have been so unhappy that I have been crying. 0  The thought of harming myself has occurred to me. 0  Edinburgh Postnatal  Depression Scale Total 0      After visit meds:  Allergies as of 01/23/2021   No Known Allergies           Discharge Care Instructions  (From admission, onward)         Start     Ordered   01/23/21 0000  Leave dressing on - Keep it clean, dry, and intact until clinic visit        01/23/21 1034           Discharge home in stable condition Infant Feeding: Bottle Infant Disposition:home with mother Discharge instruction: per After Visit Summary and Postpartum booklet. Activity: Advance as tolerated. Pelvic rest for 6 weeks.  Diet: routine diet Anticipated Birth Control: IUD Postpartum Appointment:1 week Additional Postpartum F/U: Incision check 1 week, BP check 1 week and 6 week postpartum exam and IUD placement Future Appointments: see below Follow up Visit:  Follow-up Information    Schuman, Stefanie Libel, MD In 1 week.   Specialty: Obstetrics and Gynecology Why: For incision check Contact information: [address]. [place] [address] [phone]                   01/23/2021 Imagene Riches, CNM

## 2021-01-31 ENCOUNTER — Encounter: Payer: Self-pay | Admitting: Obstetrics and Gynecology

## 2021-01-31 ENCOUNTER — Other Ambulatory Visit: Payer: Self-pay

## 2021-01-31 ENCOUNTER — Ambulatory Visit (INDEPENDENT_AMBULATORY_CARE_PROVIDER_SITE_OTHER): Payer: Medicaid Other | Admitting: Obstetrics and Gynecology

## 2021-01-31 DIAGNOSIS — Z98891 History of uterine scar from previous surgery: Secondary | ICD-10-CM

## 2021-01-31 NOTE — Progress Notes (Signed)
Postpartum Visit  Chief Complaint:  Chief Complaint  Patient presents with  . Postpartum Care    History of Present Illness: Patient is a 35 y.o. G1P1001 presents for postpartum visit.  Date of delivery: 01/21/2021 Type of delivery:cesarean, T uterine incision Pregnancy or labor problems: fetal tachycardia  Breast Feeding:  no Lochia: sometimes light, sometimes heavy Post partum depression/anxiety noted:  no Edinburgh Post-Partum Depression Score:  3  Date of last PAP: 09/21/2020 ASCUS HPV negative Any problems since the delivery:  no  Newborn Details:  SINGLETON :  1. Baby Gender:female.  Infant Status: Infant doing well at home with mother.   Review of Systems: Review of Systems  Constitutional: Negative for chills, fever, malaise/fatigue and weight loss.  HENT: Negative for congestion, hearing loss and sinus pain.   Eyes: Negative for blurred vision and double vision.  Respiratory: Negative for cough, sputum production, shortness of breath and wheezing.   Cardiovascular: Negative for chest pain, palpitations, orthopnea and leg swelling.  Gastrointestinal: Negative for abdominal pain, constipation, diarrhea, nausea and vomiting.  Genitourinary: Negative for dysuria, flank pain, frequency, hematuria and urgency.  Musculoskeletal: Negative for back pain, falls and joint pain.  Skin: Negative for itching and rash.  Neurological: Negative for dizziness and headaches.  Psychiatric/Behavioral: Negative for depression, substance abuse and suicidal ideas. The patient is not nervous/anxious.      Past Medical History:  Past Medical History:  Diagnosis Date  . GERD (gastroesophageal reflux disease)   . Gestational diabetes   . Hypertension   . Irregular heart beat   . Vitamin D deficiency     Past Surgical History:  Past Surgical History:  Procedure Laterality Date  . CESAREAN SECTION  01/21/2021   Procedure: CESAREAN SECTION;  Surgeon: Natale Milch, MD;   Location: [facility] ORS;  Service: Obstetrics;;  . NO PAST SURGERIES      Family History:  Family History  Problem Relation Age of Onset  . Cancer Mother   . Cancer Maternal Grandmother     Social History:  Social History   Socioeconomic History  . Marital status: Married    Spouse name: Molly Maduro  . Number of children: Not on file  . Years of education: Not on file  . Highest education level: Not on file  Occupational History  . Not on file  Tobacco Use  . Smoking status: Former Smoker    Packs/day: 0.75    Years: 1.00    Pack years: 0.75    Quit date: 10/20/2013    Years since quitting: 7.2  . Smokeless tobacco: Never Used  Vaping Use  . Vaping Use: Never used  Substance and Sexual Activity  . Alcohol use: Never  . Drug use: Never  . Sexual activity: Yes    Birth control/protection: I.U.D.  Other Topics Concern  . Not on file  Social History Narrative  . Not on file   Social Determinants of Health   Financial Resource Strain: Not on file  Food Insecurity: Not on file  Transportation Needs: Not on file  Physical Activity: Not on file  Stress: Not on file  Social Connections: Not on file  Intimate Partner Violence: Not on file    Allergies:  No Known Allergies  Medications: Prior to Admission medications   Medication Sig Start Date End Date Taking? Authorizing Provider  ibuprofen (ADVIL) 800 MG tablet Take 1 tablet (800 mg total) by mouth every 8 (eight) hours. 01/23/21  Yes Mirna Mires, CNM  omeprazole (  PRILOSEC) 40 MG capsule Take 40 mg by mouth daily. 08/03/20  Yes [provider]  oxyCODONE (OXY IR/ROXICODONE) 5 MG immediate release tablet Take 1-2 tablets (5-10 mg total) by mouth every 4 (four) hours as needed for moderate pain. 01/23/21  Yes Mirna Mires, CNM  Prenat w/o A Vit-FeFum-FePo-FA (CONCEPT OB) 130-92.4-1 MG CAPS Take 1 capsule by mouth daily. 11/09/20  Yes Conard Novak, MD    Physical Exam Vitals:  Vitals:   01/31/21 1620   BP: 118/70    Physical Exam Constitutional:      Appearance: Normal appearance. She is well-developed.  HENT:     Head: Normocephalic and atraumatic.  Eyes:     Extraocular Movements: Extraocular movements intact.     Pupils: Pupils are equal, round, and reactive to light.  Neck:     Thyroid: No thyromegaly.  Cardiovascular:     Rate and Rhythm: Normal rate and regular rhythm.     Heart sounds: Normal heart sounds.  Pulmonary:     Effort: Pulmonary effort is normal.     Breath sounds: Normal breath sounds.  Abdominal:     General: Bowel sounds are normal. There is no distension.     Palpations: Abdomen is soft. There is no mass.     Comments: Incision is clean, dry, and intact. Healing very well.  Musculoskeletal:     Cervical back: Neck supple.  Neurological:     Mental Status: She is alert and oriented to person, place, and time.  Skin:    General: Skin is warm and dry.  Psychiatric:        Behavior: Behavior normal.        Thought Content: Thought content normal.        Judgment: Judgment normal.  Vitals reviewed.     Assessment: 35 y.o. G1P1001 presenting for 6 week postpartum visit  Plan: Problem List Items Addressed This Visit   None      1) Contraception-  Desires MIrena IUD  2)  Pap: ASCCP guidelines and rational discussed.  Patient opts for 1 year screening interval.  3) Patient underwent screening for postpartum depression with no concerns noted.  4) Patient's pregnancy was complicated by gestational diabetes. Post partum screening with hgba1c or 2 hour GTT will need to be performed 6-12 weeks postpartum. If GTT is normal patient will need screening for diabetes every three years.  - Follow up in 4 weeks Adelene Idler MD, [facility], [facility] 01/31/2021 4:41 PM

## 2021-01-31 NOTE — Patient Instructions (Signed)
Levonorgestrel intrauterine device (IUD) What is this medicine? LEVONORGESTREL IUD (LEE voe nor jes trel) is a contraceptive (birth control) device. The device is placed inside the uterus by a health care provider. It is used to prevent pregnancy. Some devices can also be used to treat heavy bleeding that occurs during your period. This medicine may be used for other purposes; ask your health care provider or pharmacist if you have questions. COMMON BRAND NAME(S): Kyleena, LILETTA, Mirena, Skyla What should I tell my health care provider before I take this medicine? They need to know if you have any of these conditions:  abnormal Pap smear  cancer of the breast, uterus, or cervix  diabetes  endometritis  genital or pelvic infection now or in the past  have more than one sexual partner or your partner has more than one partner  heart disease  history of an ectopic or tubal pregnancy  immune system problems  IUD in place  liver disease or tumor  problems with blood clots or take blood-thinners  seizures  use intravenous drugs  uterus of unusual shape  vaginal bleeding that has not been explained  an unusual or allergic reaction to levonorgestrel, other hormones, silicone, or polyethylene, medicines, foods, dyes, or preservatives  pregnant or trying to get pregnant  breast-feeding How should I use this medicine? This device is placed inside the uterus by a health care professional. Talk to your pediatrician regarding the use of this medicine in children. Special care may be needed. Overdosage: If you think you have taken too much of this medicine contact a poison control center or emergency room at once. NOTE: This medicine is only for you. Do not share this medicine with others. What if I miss a dose? This does not apply. Depending on the brand of device you have inserted, the device will need to be replaced every 3 to 7 years if you wish to continue using this type  of birth control. What may interact with this medicine? Do not take this medicine with any of the following medications:  amprenavir  bosentan  fosamprenavir This medicine may also interact with the following medications:  aprepitant  armodafinil  barbiturate medicines for inducing sleep or treating seizures  bexarotene  boceprevir  griseofulvin  medicines to treat seizures like carbamazepine, ethotoin, felbamate, oxcarbazepine, phenytoin, topiramate  modafinil  pioglitazone  rifabutin  rifampin  rifapentine  some medicines to treat HIV infection like atazanavir, efavirenz, indinavir, lopinavir, nelfinavir, tipranavir, ritonavir  St. John's wort  warfarin This list may not describe all possible interactions. Give your health care provider a list of all the medicines, herbs, non-prescription drugs, or dietary supplements you use. Also tell them if you smoke, drink alcohol, or use illegal drugs. Some items may interact with your medicine. What should I watch for while using this medicine? Visit your doctor or health care professional for regular check ups. See your doctor if you or your partner has sexual contact with others, becomes HIV positive, or gets a sexual transmitted disease. This product does not protect you against HIV infection (AIDS) or other sexually transmitted diseases. You can check the placement of the IUD yourself by reaching up to the top of your vagina with clean fingers to feel the threads. Do not pull on the threads. It is a good habit to check placement after each menstrual period. Call your doctor right away if you feel more of the IUD than just the threads or if you cannot feel the threads   at all. The IUD may come out by itself. You may become pregnant if the device comes out. If you notice that the IUD has come out use a backup birth control method like condoms and call your health care provider. Using tampons will not change the position of the  IUD and are okay to use during your period. This IUD can be safely scanned with magnetic resonance imaging (MRI) only under specific conditions. Before you have an MRI, tell your healthcare provider that you have an IUD in place, and which type of IUD you have in place. What side effects may I notice from receiving this medicine? Side effects that you should report to your doctor or health care professional as soon as possible:  allergic reactions like skin rash, itching or hives, swelling of the face, lips, or tongue  fever, flu-like symptoms  genital sores  high blood pressure  no menstrual period for 6 weeks during use  pain, swelling, warmth in the leg  pelvic pain or tenderness  severe or sudden headache  signs of pregnancy  stomach cramping  sudden shortness of breath  trouble with balance, talking, or walking  unusual vaginal bleeding, discharge  yellowing of the eyes or skin Side effects that usually do not require medical attention (report to your doctor or health care professional if they continue or are bothersome):  acne  breast pain  change in sex drive or performance  changes in weight  cramping, dizziness, or faintness while the device is being inserted  headache  irregular menstrual bleeding within first 3 to 6 months of use  nausea This list may not describe all possible side effects. Call your doctor for medical advice about side effects. You may report side effects to FDA at 1-800-FDA-1088. Where should I keep my medicine? This does not apply. NOTE: This sheet is a summary. It may not cover all possible information. If you have questions about this medicine, talk to your doctor, pharmacist, or health care provider.  2021 Elsevier/Gold Standard (2020-06-05 16:27:45)  

## 2021-03-01 ENCOUNTER — Encounter: Payer: Self-pay | Admitting: Obstetrics and Gynecology

## 2021-03-01 ENCOUNTER — Ambulatory Visit (INDEPENDENT_AMBULATORY_CARE_PROVIDER_SITE_OTHER): Payer: Medicaid Other | Admitting: Obstetrics and Gynecology

## 2021-03-01 ENCOUNTER — Other Ambulatory Visit: Payer: Self-pay

## 2021-03-01 VITALS — BP 128/70 | Ht 63.0 in | Wt 182.2 lb

## 2021-03-01 DIAGNOSIS — Z30430 Encounter for insertion of intrauterine contraceptive device: Secondary | ICD-10-CM | POA: Diagnosis not present

## 2021-03-01 DIAGNOSIS — O24410 Gestational diabetes mellitus in pregnancy, diet controlled: Secondary | ICD-10-CM | POA: Diagnosis not present

## 2021-03-01 DIAGNOSIS — Z3040 Encounter for surveillance of contraceptives, unspecified: Secondary | ICD-10-CM | POA: Diagnosis not present

## 2021-03-01 DIAGNOSIS — Z98891 History of uterine scar from previous surgery: Secondary | ICD-10-CM

## 2021-03-01 LAB — POCT URINE PREGNANCY: Preg Test, Ur: NEGATIVE

## 2021-03-01 NOTE — Progress Notes (Signed)
Postpartum Visit  Chief Complaint:  Chief Complaint  Patient presents with  . Procedure    History of Present Illness: Patient is a 35 y.o. G1P1001 presents for postpartum visit.  Date of delivery: 01/21/2021 Type of delivery: cesarean, T uterine incision Laceration: none  Pregnancy or labor problems: fetal tachycardia  Breast Feeding:  no Lochia: less flow than a normal period Post partum depression/anxiety noted:  no Date of last PAP: 09/21/2020  ASCUS HPV negative  Any problems since the delivery:  no  Newborn Details:  SINGLETON :  1. Baby Gender:female.  Infant Status: Infant doing well at home with mother.   Review of Systems: ROS   Past Medical History:  Past Medical History:  Diagnosis Date  . GERD (gastroesophageal reflux disease)   . Gestational diabetes   . Hypertension   . Irregular heart beat   . Vitamin D deficiency     Past Surgical History:  Past Surgical History:  Procedure Laterality Date  . CESAREAN SECTION  01/21/2021   Procedure: CESAREAN SECTION;  Surgeon: Natale Milch, MD;  Location: [facility] ORS;  Service: Obstetrics;;  . NO PAST SURGERIES      Family History:  Family History  Problem Relation Age of Onset  . Cancer Mother   . Cancer Maternal Grandmother     Social History:  Social History   Socioeconomic History  . Marital status: Married    Spouse name: Molly Maduro  . Number of children: Not on file  . Years of education: Not on file  . Highest education level: Not on file  Occupational History  . Not on file  Tobacco Use  . Smoking status: Former Smoker    Packs/day: 0.75    Years: 1.00    Pack years: 0.75    Quit date: 10/20/2013    Years since quitting: 7.3  . Smokeless tobacco: Never Used  Vaping Use  . Vaping Use: Never used  Substance and Sexual Activity  . Alcohol use: Never  . Drug use: Never  . Sexual activity: Yes    Birth control/protection: I.U.D.  Other Topics Concern  . Not on file  Social History  Narrative  . Not on file   Social Determinants of Health   Financial Resource Strain: Not on file  Food Insecurity: Not on file  Transportation Needs: Not on file  Physical Activity: Not on file  Stress: Not on file  Social Connections: Not on file  Intimate Partner Violence: Not on file    Allergies:  No Known Allergies  Medications: Prior to Admission medications   Medication Sig Start Date End Date Taking? Authorizing Provider  ibuprofen (ADVIL) 800 MG tablet Take 1 tablet (800 mg total) by mouth every 8 (eight) hours. 01/23/21  Yes Mirna Mires, CNM  omeprazole (PRILOSEC) 40 MG capsule Take 40 mg by mouth daily. 08/03/20  Yes [provider]  Prenat w/o A Vit-FeFum-FePo-FA (CONCEPT OB) 130-92.4-1 MG CAPS Take 1 capsule by mouth daily. 11/09/20  Yes Conard Novak, MD  oxyCODONE (OXY IR/ROXICODONE) 5 MG immediate release tablet Take 1-2 tablets (5-10 mg total) by mouth every 4 (four) hours as needed for moderate pain. Patient not taking: Reported on 03/01/2021 01/23/21   Mirna Mires, CNM    Physical Exam Vitals:  Vitals:   03/01/21 1509  BP: 128/70    OBGyn Exam    IUD PROCEDURE NOTE:  Kristen Jefferson is a 35 y.o. G1P1001 here for IUD insertion. No GYN concerns.  Last pap smear 2021. Pregnancy excluded: negative pregnancy test today in office  IUD Insertion Procedure Note Patient identified, informed consent performed, consent signed.   Discussed risks of irregular bleeding, cramping, infection, malpositioning or misplacement of the IUD outside the uterus which may require further procedure such as laparoscopy, risk of failure <1%. Time out was performed.    A bimanual exam showed the uterus to be anteverted.  Speculum placed in the vagina.  Cervix visualized.  Cleaned with Betadine x 2.  Grasped anteriorly with a single tooth tenaculum.  Uterus sounded to 9 cm.   Mirena IUD placed per manufacturer's recommendations.  Strings trimmed to 3 cm.  Tenaculum was removed, good hemostasis noted.  Patient tolerated procedure well.   Patient was given post-procedure instructions.  She was advised to have backup contraception for one week.  Patient was also asked to check IUD strings periodically and follow up in 4 weeks for IUD check.   Assessment: 36 y.o. G1P1001 presenting for 6 week postpartum visit  Plan: Problem List Items Addressed This Visit      Endocrine   Gestational diabetes mellitus (GDM), antepartum   Relevant Orders   Glucose tolerance, 2 hours     Other   Postpartum care following cesarean delivery    Other Visit Diagnoses    Encounter for surveillance of contraceptive device    -  Primary   Relevant Orders   POCT urine pregnancy (Completed)   Encounter for IUD insertion       S/P cesarean section           1) Contraception-  IUD inserted  2)  Pap: ASCCP guidelines and rational discussed.  Patient opts for 1 year screening interval.  3) Patient underwent screening for postpartum depression with no concerns noted.  4) Patient's pregnancy was complicated by gestational diabetes. Post partum screening with 2 hour GTT performed today. If GTT is normal patient will need screening for diabetes every three years.  - Follow up in 4 weeks  for IUD check and 2hr GTT.  Adelene Idler MD, [facility], [facility] 03/01/2021 3:45 PM

## 2021-03-01 NOTE — Patient Instructions (Signed)
Levonorgestrel intrauterine device (IUD) What is this medicine? LEVONORGESTREL IUD (LEE voe nor jes trel) is a contraceptive (birth control) device. The device is placed inside the uterus by a health care provider. It is used to prevent pregnancy. Some devices can also be used to treat heavy bleeding that occurs during your period. This medicine may be used for other purposes; ask your health care provider or pharmacist if you have questions. COMMON BRAND NAME(S): Kyleena, LILETTA, Mirena, Skyla What should I tell my health care provider before I take this medicine? They need to know if you have any of these conditions:  abnormal Pap smear  cancer of the breast, uterus, or cervix  diabetes  endometritis  genital or pelvic infection now or in the past  have more than one sexual partner or your partner has more than one partner  heart disease  history of an ectopic or tubal pregnancy  immune system problems  IUD in place  liver disease or tumor  problems with blood clots or take blood-thinners  seizures  use intravenous drugs  uterus of unusual shape  vaginal bleeding that has not been explained  an unusual or allergic reaction to levonorgestrel, other hormones, silicone, or polyethylene, medicines, foods, dyes, or preservatives  pregnant or trying to get pregnant  breast-feeding How should I use this medicine? This device is placed inside the uterus by a health care professional. Talk to your pediatrician regarding the use of this medicine in children. Special care may be needed. Overdosage: If you think you have taken too much of this medicine contact a poison control center or emergency room at once. NOTE: This medicine is only for you. Do not share this medicine with others. What if I miss a dose? This does not apply. Depending on the brand of device you have inserted, the device will need to be replaced every 3 to 7 years if you wish to continue using this type  of birth control. What may interact with this medicine? Do not take this medicine with any of the following medications:  amprenavir  bosentan  fosamprenavir This medicine may also interact with the following medications:  aprepitant  armodafinil  barbiturate medicines for inducing sleep or treating seizures  bexarotene  boceprevir  griseofulvin  medicines to treat seizures like carbamazepine, ethotoin, felbamate, oxcarbazepine, phenytoin, topiramate  modafinil  pioglitazone  rifabutin  rifampin  rifapentine  some medicines to treat HIV infection like atazanavir, efavirenz, indinavir, lopinavir, nelfinavir, tipranavir, ritonavir  St. John's wort  warfarin This list may not describe all possible interactions. Give your health care provider a list of all the medicines, herbs, non-prescription drugs, or dietary supplements you use. Also tell them if you smoke, drink alcohol, or use illegal drugs. Some items may interact with your medicine. What should I watch for while using this medicine? Visit your doctor or health care professional for regular check ups. See your doctor if you or your partner has sexual contact with others, becomes HIV positive, or gets a sexual transmitted disease. This product does not protect you against HIV infection (AIDS) or other sexually transmitted diseases. You can check the placement of the IUD yourself by reaching up to the top of your vagina with clean fingers to feel the threads. Do not pull on the threads. It is a good habit to check placement after each menstrual period. Call your doctor right away if you feel more of the IUD than just the threads or if you cannot feel the threads   at all. The IUD may come out by itself. You may become pregnant if the device comes out. If you notice that the IUD has come out use a backup birth control method like condoms and call your health care provider. Using tampons will not change the position of the  IUD and are okay to use during your period. This IUD can be safely scanned with magnetic resonance imaging (MRI) only under specific conditions. Before you have an MRI, tell your healthcare provider that you have an IUD in place, and which type of IUD you have in place. What side effects may I notice from receiving this medicine? Side effects that you should report to your doctor or health care professional as soon as possible:  allergic reactions like skin rash, itching or hives, swelling of the face, lips, or tongue  fever, flu-like symptoms  genital sores  high blood pressure  no menstrual period for 6 weeks during use  pain, swelling, warmth in the leg  pelvic pain or tenderness  severe or sudden headache  signs of pregnancy  stomach cramping  sudden shortness of breath  trouble with balance, talking, or walking  unusual vaginal bleeding, discharge  yellowing of the eyes or skin Side effects that usually do not require medical attention (report to your doctor or health care professional if they continue or are bothersome):  acne  breast pain  change in sex drive or performance  changes in weight  cramping, dizziness, or faintness while the device is being inserted  headache  irregular menstrual bleeding within first 3 to 6 months of use  nausea This list may not describe all possible side effects. Call your doctor for medical advice about side effects. You may report side effects to FDA at 1-800-FDA-1088. Where should I keep my medicine? This does not apply. NOTE: This sheet is a summary. It may not cover all possible information. If you have questions about this medicine, talk to your doctor, pharmacist, or health care provider.  2021 Elsevier/Gold Standard (2020-06-05 16:27:45) IUD PLACEMENT POST-PROCEDURE INSTRUCTIONS  1. You may take Ibuprofen, Aleve or Tylenol for pain if needed.  Cramping should resolve within in 24 hours.  2. You may have a small  amount of spotting.  You should wear a mini pad for the next few days.  3. You may have intercourse after 24 hours.  If you using this for birth control, it is effective immediately.  4. You need to call if you have any pelvic pain, fever, heavy bleeding or foul smelling vaginal discharge.  Irregular bleeding is common the first several months after having an IUD placed. You do not need to call for this reason unless you are concerned.  5. Shower or bathe as normal  6. You should have a follow-up appointment in 4-8 weeks for a re-check to make sure you are not having any problems.

## 2021-04-04 ENCOUNTER — Ambulatory Visit (INDEPENDENT_AMBULATORY_CARE_PROVIDER_SITE_OTHER): Payer: Medicaid Other | Admitting: Obstetrics and Gynecology

## 2021-04-04 ENCOUNTER — Other Ambulatory Visit: Payer: Self-pay

## 2021-04-04 ENCOUNTER — Encounter: Payer: Self-pay | Admitting: Obstetrics and Gynecology

## 2021-04-04 ENCOUNTER — Other Ambulatory Visit: Payer: Medicaid Other

## 2021-04-04 VITALS — BP 120/72 | Ht 64.0 in | Wt 181.2 lb

## 2021-04-04 DIAGNOSIS — O24410 Gestational diabetes mellitus in pregnancy, diet controlled: Secondary | ICD-10-CM

## 2021-04-04 DIAGNOSIS — Z30431 Encounter for routine checking of intrauterine contraceptive device: Secondary | ICD-10-CM

## 2021-04-04 NOTE — Progress Notes (Signed)
  History of Present Illness:  Kristen Jefferson is a 35 y.o. that had a Paragard IUD placed approximately 4 weeks ago. Since that time, she states that she has just stopped having bleeding, she is happy with the device. PMHx: She  has a past medical history of GERD (gastroesophageal reflux disease), Gestational diabetes, Hypertension, Irregular heart beat, and Vitamin D deficiency. Also,  has a past surgical history that includes No past surgeries and Cesarean section (01/21/2021)., family history includes Cancer in her maternal grandmother and mother.,  reports that she quit smoking about 7 years ago. She has a 0.75 pack-year smoking history. She has never used smokeless tobacco. She reports that she does not drink alcohol and does not use drugs. No outpatient medications have been marked as taking for the 04/04/21 encounter (Office Visit) with Natale Milch, MD.  .  Also, has No Known Allergies..  ROS  Physical Exam:  BP 120/72   Ht 5\' 4"  (1.626 m)   Wt 181 lb 3.2 oz (82.2 kg)   BMI 31.10 kg/m  Body mass index is 31.1 kg/m. Constitutional: Well nourished, well developed female in no acute distress.  Abdomen: diffusely non tender to palpation, non distended, and no masses, hernias Neuro: Grossly intact Psych:  Normal mood and affect.    Pelvic exam:  Two IUD strings present seen coming from the cervical os. EGBUS, vaginal vault and cervix: within normal limits  Assessment: IUD strings present in proper location; pt doing well  Plan: She was told to continue to use barrier contraception, in order to prevent any STIs, and to take a home pregnancy test or call if she ever thinks she may be pregnant, and that her IUD expires in 10 years.  She was amenable to this plan and we will see her back in 1 year/PRN.  A total of 10 minutes were spent face-to-face with the patient as well as preparation, review, communication, and documentation during this encounter.   [place]  MD, Adelene Idler [facility], [facility] 04/04/2021 10:24 AM

## 2021-04-05 LAB — GLUCOSE TOLERANCE, 2 HOURS
Glucose, 2 hour: 131 mg/dL (ref 65–139)
Glucose, GTT - Fasting: 98 mg/dL (ref 65–99)

## 2021-04-05 NOTE — Progress Notes (Signed)
Please call patient with normal glucose test result. She does not have evidence of diabetes. She should have screening agai in 3 years. Thank you

## 2022-12-12 ENCOUNTER — Encounter: Payer: Self-pay | Admitting: Internal Medicine

## 2022-12-12 ENCOUNTER — Ambulatory Visit: Payer: Self-pay | Admitting: Internal Medicine

## 2022-12-12 VITALS — BP 120/62 | HR 93 | Ht 63.0 in | Wt 187.0 lb

## 2022-12-12 DIAGNOSIS — K219 Gastro-esophageal reflux disease without esophagitis: Secondary | ICD-10-CM | POA: Diagnosis not present

## 2022-12-12 DIAGNOSIS — G43801 Other migraine, not intractable, with status migrainosus: Secondary | ICD-10-CM

## 2022-12-12 DIAGNOSIS — N390 Urinary tract infection, site not specified: Secondary | ICD-10-CM | POA: Diagnosis not present

## 2022-12-12 DIAGNOSIS — F411 Generalized anxiety disorder: Secondary | ICD-10-CM | POA: Diagnosis not present

## 2022-12-12 DIAGNOSIS — G43909 Migraine, unspecified, not intractable, without status migrainosus: Secondary | ICD-10-CM | POA: Insufficient documentation

## 2022-12-12 NOTE — Progress Notes (Signed)
Established Patient Office Visit  Subjective:  Patient ID: Kristen Jefferson, female    DOB: May 24, 1986  Age: 37 y.o. MRN: [id number]  Chief Complaint  Patient presents with   Follow-up    6 week follow up    Patient comes in for follow-up.  Today she reports that she is not having any more abdominal pain or acid reflux with heartburn.  She is taking her omeprazole as well as famotidine quite regularly but is planning to ease up on them now. Patient has had recently 2 episodes of urinary tract infection.  Will check her urine again today and,  will refer to se urologist for her recurrent UTI.  She is not complaining of any flank pain or bladder pain but continues to have blood and pus cells in her urine.  And her urine cultures continue to grow E. coli , which is sensitive to conventional antibiotics  Her renal bladder ultrasound was unremarkable. Patient may need cystoscopy.     Past Medical History:  Diagnosis Date   GERD (gastroesophageal reflux disease)    Gestational diabetes    Hypertension    Irregular heart beat    Vitamin D deficiency     Social History   Socioeconomic History   Marital status: Married    Spouse name: Herbie [place]   Number of children: Not on file   Years of education: Not on file   Highest education level: Not on file  Occupational History   Not on file  Tobacco Use   Smoking status: Former    Packs/day: 0.75    Years: 1.00    Total pack years: 0.75    Types: Cigarettes    Quit date: 10/20/2013    Years since quitting: 9.1   Smokeless tobacco: Never  Vaping Use   Vaping Use: Never used  Substance and Sexual Activity   Alcohol use: Never   Drug use: Never   Sexual activity: Yes    Birth control/protection: I.U.D.  Other Topics Concern   Not on file  Social History Narrative   Not on file   Social Determinants of Health   Financial Resource Strain: Not on file  Food Insecurity: Not on file  Transportation Needs: Not on file  Physical  Activity: Not on file  Stress: Not on file  Social Connections: Not on file  Intimate Partner Violence: Not on file    Family History  Problem Relation Age of Onset   Cancer Mother    Cancer Maternal Grandmother     No Known Allergies  Review of Systems  Constitutional:  Negative for chills, fever, malaise/fatigue and weight loss.  HENT: Negative.    Eyes: Negative.   Respiratory: Negative.    Cardiovascular: Negative.   Gastrointestinal:  Negative for abdominal pain, blood in stool, diarrhea, heartburn, melena, nausea and vomiting.  Genitourinary:  Negative for dysuria, flank pain, frequency and urgency.  Musculoskeletal: Negative.   Skin:  Negative for rash.  Endo/Heme/Allergies:  Negative for polydipsia.  Psychiatric/Behavioral: Negative.         Objective:   BP 120/62   Pulse 93   Ht '5\' 3"'$  (1.6 m)   Wt 187 lb (84.8 kg)   SpO2 97%   BMI 33.13 kg/m   Vitals:   12/12/22 1300  BP: 120/62  Pulse: 93  Height: '5\' 3"'$  (1.6 m)  Weight: 187 lb (84.8 kg)  SpO2: 97%  BMI (Calculated): 33.13    Physical Exam Vitals and nursing note reviewed.  Constitutional:      Appearance: Normal appearance.  HENT:     Head: Normocephalic.  Cardiovascular:     Rate and Rhythm: Normal rate and regular rhythm.     Pulses: Normal pulses.     Heart sounds: Normal heart sounds.  Pulmonary:     Effort: Pulmonary effort is normal.     Breath sounds: Normal breath sounds.  Abdominal:     General: Abdomen is flat. Bowel sounds are normal.     Palpations: Abdomen is soft.  Musculoskeletal:        General: Normal range of motion.     Cervical back: Normal range of motion and neck supple.  Skin:    General: Skin is warm and dry.  Neurological:     General: No focal deficit present.     Mental Status: She is alert and oriented to person, place, and time.  Psychiatric:        Mood and Affect: Mood normal.        Behavior: Behavior normal.      No results found for any visits  on 12/12/22.  No results found for this or any previous visit (from the past 2160 hour(s)).    Assessment & Plan:   Problem List Items Addressed This Visit     Recurrent UTI - Primary   Relevant Orders   Urinalysis   Urine Culture   Ambulatory referral to Urology   Gastroesophageal reflux disease without esophagitis   Relevant Medications   famotidine (PEPCID) 20 MG tablet   ondansetron (ZOFRAN) 4 MG tablet   GAD (generalized anxiety disorder)   Migraine    Return in about 3 months (around 03/12/2023).   Total time spent: 30 minutes  Perrin Maltese, MD  12/12/2022

## 2022-12-13 LAB — URINALYSIS
Bilirubin, UA: NEGATIVE
Glucose, UA: NEGATIVE
Ketones, UA: NEGATIVE
Nitrite, UA: NEGATIVE
RBC, UA: NEGATIVE
Specific Gravity, UA: 1.025 (ref 1.005–1.030)
Urobilinogen, Ur: 0.2 mg/dL (ref 0.2–1.0)
pH, UA: 7.5 (ref 5.0–7.5)

## 2022-12-15 LAB — URINE CULTURE

## 2023-11-13 ENCOUNTER — Ambulatory Visit: Payer: Medicaid Other | Admitting: Cardiology

## 2023-11-13 ENCOUNTER — Encounter: Payer: Self-pay | Admitting: Cardiology

## 2023-11-13 VITALS — BP 122/64 | HR 110 | Temp 97.6°F | Ht 63.0 in | Wt 188.0 lb

## 2023-11-13 DIAGNOSIS — R051 Acute cough: Secondary | ICD-10-CM | POA: Diagnosis not present

## 2023-11-13 DIAGNOSIS — Z0130 Encounter for examination of blood pressure without abnormal findings: Secondary | ICD-10-CM

## 2023-11-13 DIAGNOSIS — G4483 Primary cough headache: Secondary | ICD-10-CM

## 2023-11-13 DIAGNOSIS — J0110 Acute frontal sinusitis, unspecified: Secondary | ICD-10-CM | POA: Diagnosis not present

## 2023-11-13 MED ORDER — FLUTICASONE PROPIONATE 50 MCG/ACT NA SUSP: 1.0000 | Freq: Every day | NASAL | 2 refills | Status: AC

## 2023-11-13 MED ORDER — AMOXICILLIN-POT CLAVULANATE 875-125 MG PO TABS: 1.0000 | ORAL_TABLET | Freq: Two times a day (BID) | ORAL | 0 refills | Status: AC

## 2023-11-13 NOTE — Progress Notes (Signed)
Established Patient Office Visit  Subjective:  Patient ID: Kristen Jefferson, female    DOB: 10-Feb-1986  Age: 38 y.o. MRN: [id number]  Chief Complaint  Patient presents with   Acute Visit    Cold symptoms, sinus pressure, cough    Patient in office for an acute visit, complaining of cold symptoms, sinus pressure , cough, green sputum. Negative for rhinorrhea, positive for PND. Will send in Augmentin and Flonase. Continue using Mucinex. Drink plenty of water.   Sinus Problem This is a new problem. The current episode started in the past 7 days. The problem is unchanged. There has been no fever. The pain is mild. Associated symptoms include congestion, coughing, headaches, sinus pressure and a sore throat. Pertinent negatives include no shortness of breath. Treatments tried: Mucinex. The treatment provided no relief.    No other concerns at this time.   Past Medical History:  Diagnosis Date   GERD (gastroesophageal reflux disease)    Gestational diabetes    Hypertension    Irregular heart beat    Vitamin D deficiency     Past Surgical History:  Procedure Laterality Date   CESAREAN SECTION  01/21/2021   Procedure: CESAREAN SECTION;  Surgeon: Natale Milch, MD;  Location: [facility] ORS;  Service: Obstetrics;;   NO PAST SURGERIES      Social History   Socioeconomic History   Marital status: Married    Spouse name: Molly Maduro   Number of children: Not on file   Years of education: Not on file   Highest education level: Not on file  Occupational History   Not on file  Tobacco Use   Smoking status: Former    Current packs/day: 0.00    Average packs/day: 0.8 packs/day for 1 year (0.8 ttl pk-yrs)    Types: Cigarettes    Start date: 10/20/2012    Quit date: 10/20/2013    Years since quitting: 10.0   Smokeless tobacco: Never  Vaping Use   Vaping status: Never Used  Substance and Sexual Activity   Alcohol use: Never   Drug use: Never   Sexual activity: Yes    Birth  control/protection: I.U.D.  Other Topics Concern   Not on file  Social History Narrative   Not on file   Social Drivers of Health   Financial Resource Strain: Not on file  Food Insecurity: Not on file  Transportation Needs: Not on file  Physical Activity: Not on file  Stress: Not on file  Social Connections: Not on file  Intimate Partner Violence: Not on file    Family History  Problem Relation Age of Onset   Cancer Mother    Cancer Maternal Grandmother     No Known Allergies  Outpatient Medications Prior to Visit  Medication Sig   famotidine (PEPCID) 20 MG tablet Take 20 mg by mouth 2 (two) times daily.   omeprazole (PRILOSEC) 40 MG capsule Take 40 mg by mouth daily.   ondansetron (ZOFRAN) 4 MG tablet Take 4 mg by mouth every 6 (six) hours as needed.   No facility-administered medications prior to visit.    Review of Systems  Constitutional: Negative.   HENT:  Positive for congestion, sinus pressure, sinus pain and sore throat.   Eyes: Negative.   Respiratory:  Positive for cough and sputum production. Negative for shortness of breath and wheezing.   Cardiovascular: Negative.  Negative for chest pain.  Gastrointestinal: Negative.  Negative for abdominal pain, constipation and diarrhea.  Genitourinary: Negative.  Musculoskeletal:  Negative for joint pain and myalgias.  Skin: Negative.   Neurological:  Positive for headaches. Negative for dizziness.  Endo/Heme/Allergies: Negative.   All other systems reviewed and are negative.      Objective:   BP 122/64   Pulse (!) 110   Temp 97.6 F (36.4 C) (Tympanic)   Ht 5\' 3"  (1.6 m)   Wt 188 lb (85.3 kg)   SpO2 99%   BMI 33.30 kg/m   Vitals:   11/13/23 1421  BP: 122/64  Pulse: (!) 110  Temp: 97.6 F (36.4 C)  Height: 5\' 3"  (1.6 m)  Weight: 188 lb (85.3 kg)  SpO2: 99%  TempSrc: Tympanic  BMI (Calculated): 33.31    Physical Exam Vitals and nursing note reviewed.  Constitutional:      Appearance:  Normal appearance. She is normal weight.  HENT:     Head: Normocephalic and atraumatic.     Nose: Nose normal.     Mouth/Throat:     Mouth: Mucous membranes are moist.  Eyes:     Extraocular Movements: Extraocular movements intact.     Conjunctiva/sclera: Conjunctivae normal.     Pupils: Pupils are equal, round, and reactive to light.  Cardiovascular:     Rate and Rhythm: Normal rate and regular rhythm.     Pulses: Normal pulses.     Heart sounds: Normal heart sounds.  Pulmonary:     Effort: Pulmonary effort is normal.     Breath sounds: Normal breath sounds.  Abdominal:     General: Abdomen is flat. Bowel sounds are normal.     Palpations: Abdomen is soft.  Musculoskeletal:        General: Normal range of motion.     Cervical back: Normal range of motion.  Skin:    General: Skin is warm and dry.  Neurological:     General: No focal deficit present.     Mental Status: She is alert and oriented to person, place, and time.  Psychiatric:        Mood and Affect: Mood normal.        Behavior: Behavior normal.        Thought Content: Thought content normal.        Judgment: Judgment normal.      No results found for any visits on 11/13/23.  No results found for this or any previous visit (from the past 2160 hours).    Assessment & Plan:  Augmentin  Flonase Continue Mucinex Drink plenty of water  Problem List Items Addressed This Visit       Respiratory   Acute non-recurrent frontal sinusitis - Primary   Relevant Medications   amoxicillin-clavulanate (AUGMENTIN) 875-125 MG tablet   fluticasone (FLONASE) 50 MCG/ACT nasal spray    Return if symptoms worsen or fail to improve.   Total time spent: 25 minutes  [employer], NP  11/13/2023   This document may have been prepared by Dragon Voice Recognition software and as such may include unintentional dictation errors.

## 2024-09-30 ENCOUNTER — Ambulatory Visit: Payer: Self-pay | Admitting: Internal Medicine

## 2024-09-30 ENCOUNTER — Encounter: Payer: Self-pay | Admitting: Internal Medicine

## 2024-09-30 ENCOUNTER — Ambulatory Visit: Admitting: Internal Medicine

## 2024-09-30 VITALS — BP 114/84 | HR 108 | Ht 63.0 in | Wt 198.6 lb

## 2024-09-30 DIAGNOSIS — Z1389 Encounter for screening for other disorder: Secondary | ICD-10-CM

## 2024-09-30 DIAGNOSIS — Z0130 Encounter for examination of blood pressure without abnormal findings: Secondary | ICD-10-CM

## 2024-09-30 DIAGNOSIS — R350 Frequency of micturition: Secondary | ICD-10-CM | POA: Diagnosis not present

## 2024-09-30 DIAGNOSIS — N912 Amenorrhea, unspecified: Secondary | ICD-10-CM | POA: Diagnosis not present

## 2024-09-30 LAB — POCT URINALYSIS DIPSTICK
Bilirubin, UA: NEGATIVE
Blood, UA: NEGATIVE
Glucose, UA: NEGATIVE
Ketones, UA: NEGATIVE
Nitrite, UA: POSITIVE
Protein, UA: NEGATIVE
Spec Grav, UA: 1.015 (ref 1.010–1.025)
Urobilinogen, UA: 0.2 U/dL
pH, UA: 5.5 (ref 5.0–8.0)

## 2024-09-30 LAB — POCT URINE PREGNANCY: Preg Test, Ur: NEGATIVE

## 2024-09-30 MED ORDER — NITROFURANTOIN MONOHYD MACRO 100 MG PO CAPS: 100.0000 mg | ORAL_CAPSULE | Freq: Two times a day (BID) | ORAL | 0 refills | Status: AC

## 2024-09-30 NOTE — Progress Notes (Signed)
 Established Patient Office Visit  Subjective:  Patient ID: Kristen Jefferson, female    DOB: 1986/03/23  Age: 38 y.o. MRN: [id number]  Chief Complaint  Patient presents with   Annual Exam    CPE    Please comes in with complaints of increased urinary frequency.  She has not been in the office since earlier this year when she came for a sinus infection.  But back in 11/2022 she had complained of recurrent urinary tract infections and was referred to a urologist.  But she never went to see one.  Today she reports that she got a call from an office, thinks it was ours but could be the OB/GYN that she needs to return for a test.  Since it was not our office, she will contact her OB/GYN and check if she needs to make an appointment. Her urine dipstick however shows small pus cells, will send her for culture and empirically start on Macrobid for 3 days.  Her pregnancy test is negative.    No other concerns at this time.   Past Medical History:  Diagnosis Date   GERD (gastroesophageal reflux disease)    Gestational diabetes    Hypertension    Irregular heart beat    Vitamin D deficiency     Past Surgical History:  Procedure Laterality Date   CESAREAN SECTION  01/21/2021   Procedure: CESAREAN SECTION;  Surgeon: Victor Claudell SAUNDERS, MD;  Location: [facility] ORS;  Service: Obstetrics;;   NO PAST SURGERIES      Social History   Socioeconomic History   Marital status: Married    Spouse name: Lamar   Number of children: Not on file   Years of education: Not on file   Highest education level: Not on file  Occupational History   Not on file  Tobacco Use   Smoking status: Former    Current packs/day: 0.00    Average packs/day: 0.8 packs/day for 1 year (0.8 ttl pk-yrs)    Types: Cigarettes    Start date: 10/20/2012    Quit date: 10/20/2013    Years since quitting: 10.9   Smokeless tobacco: Never  Vaping Use   Vaping status: Never Used  Substance and Sexual Activity   Alcohol use:  Never   Drug use: Never   Sexual activity: Yes    Birth control/protection: I.U.D.  Other Topics Concern   Not on file  Social History Narrative   Not on file   Social Drivers of Health   Tobacco Use: Medium Risk (09/30/2024)   Patient History    Smoking Tobacco Use: Former    Smokeless Tobacco Use: Never    Passive Exposure: Not on Actuary Strain: Not on file  Food Insecurity: Not on file  Transportation Needs: Not on file  Physical Activity: Not on file  Stress: Not on file  Social Connections: Not on file  Intimate Partner Violence: Not on file  Depression (EYV7-0): Not on file  Alcohol Screen: Not on file  Housing: Not on file  Utilities: Not on file  Health Literacy: Not on file    Family History  Problem Relation Age of Onset   Cancer Mother    Cancer Maternal Grandmother     Allergies[1]  Show/hide medication list[2]  Review of Systems  Constitutional: Negative.  Negative for chills, fever and malaise/fatigue.  HENT: Negative.  Negative for congestion and sore throat.   Eyes: Negative.  Negative for blurred vision and pain.  Respiratory:  Negative.  Negative for cough and shortness of breath.   Cardiovascular: Negative.  Negative for chest pain, palpitations and leg swelling.  Gastrointestinal: Negative.  Negative for abdominal pain, blood in stool, constipation, diarrhea, heartburn, melena, nausea and vomiting.  Genitourinary: Negative.  Negative for dysuria, flank pain, frequency and urgency.  Musculoskeletal: Negative.  Negative for joint pain and myalgias.  Skin: Negative.   Neurological: Negative.  Negative for dizziness, tingling, sensory change, weakness and headaches.  Endo/Heme/Allergies: Negative.   Psychiatric/Behavioral: Negative.  Negative for depression and suicidal ideas. The patient is not nervous/anxious.        Objective:   BP 114/84   Pulse (!) 108   Ht 5' 3 (1.6 m)   Wt 198 lb 9.6 oz (90.1 kg)   SpO2 97%   BMI  35.18 kg/m   Vitals:   09/30/24 1459  BP: 114/84  Pulse: (!) 108  Height: 5' 3 (1.6 m)  Weight: 198 lb 9.6 oz (90.1 kg)  SpO2: 97%  BMI (Calculated): 35.19    Physical Exam Vitals and nursing note reviewed.  Constitutional:      Appearance: Normal appearance.  HENT:     Head: Normocephalic and atraumatic.     Nose: Nose normal.     Mouth/Throat:     Mouth: Mucous membranes are moist.     Pharynx: Oropharynx is clear.  Eyes:     Conjunctiva/sclera: Conjunctivae normal.     Pupils: Pupils are equal, round, and reactive to light.  Cardiovascular:     Rate and Rhythm: Normal rate and regular rhythm.     Pulses: Normal pulses.     Heart sounds: Normal heart sounds. No murmur heard. Pulmonary:     Effort: Pulmonary effort is normal.     Breath sounds: Normal breath sounds. No wheezing.  Abdominal:     General: Bowel sounds are normal.     Palpations: Abdomen is soft.     Tenderness: There is no abdominal tenderness. There is no right CVA tenderness or left CVA tenderness.  Musculoskeletal:        General: Normal range of motion.     Cervical back: Normal range of motion.     Right lower leg: No edema.     Left lower leg: No edema.  Skin:    General: Skin is warm and dry.  Neurological:     General: No focal deficit present.     Mental Status: She is alert and oriented to person, place, and time.  Psychiatric:        Mood and Affect: Mood normal.        Behavior: Behavior normal.      Results for orders placed or performed in visit on 09/30/24  POCT Urinalysis Dipstick (81002)  Result Value Ref Range   Color, UA Yellow    Clarity, UA Cloudy    Glucose, UA Negative Negative   Bilirubin, UA Negative    Ketones, UA Negative    Spec Grav, UA 1.015 1.010 - 1.025   Blood, UA Negative    pH, UA 5.5 5.0 - 8.0   Protein, UA Negative Negative   Urobilinogen, UA 0.2 0.2 or 1.0 E.U./dL   Nitrite, UA Positive    Leukocytes, UA Small (1+) (A) Negative   Appearance  Cloudy    Odor Yes   POCT Urine Pregnancy  Result Value Ref Range   Preg Test, Ur Negative Negative    Recent Results (from the past 2160 hours)  POCT  Urinalysis Dipstick (18997)     Status: Abnormal   Collection Time: 09/30/24  3:39 PM  Result Value Ref Range   Color, UA Yellow    Clarity, UA Cloudy    Glucose, UA Negative Negative   Bilirubin, UA Negative    Ketones, UA Negative    Spec Grav, UA 1.015 1.010 - 1.025   Blood, UA Negative    pH, UA 5.5 5.0 - 8.0   Protein, UA Negative Negative   Urobilinogen, UA 0.2 0.2 or 1.0 E.U./dL   Nitrite, UA Positive    Leukocytes, UA Small (1+) (A) Negative   Appearance Cloudy    Odor Yes   POCT Urine Pregnancy     Status: None   Collection Time: 09/30/24  3:40 PM  Result Value Ref Range   Preg Test, Ur Negative Negative      Assessment & Plan:  Patient advised to make an appointment with her OB/GYN.  She could be due for her Pap smear. Send urine for culture and sensitivity.  Encouraged to drink more water. Problem List Items Addressed This Visit   None Visit Diagnoses       Screening for blood or protein in urine    -  Primary   Relevant Orders   POCT Urinalysis Dipstick (18997) (Completed)     Amenorrhea       Relevant Orders   POCT Urine Pregnancy (Completed)     Urinary frequency       Relevant Medications   nitrofurantoin, macrocrystal-monohydrate, (MACROBID) 100 MG capsule       Follow up 1 month, will check labs- and do pap if not done at ob/gyn.  Total time spent: 30 minutes. This time includes review of previous notes and results and patient face to face interaction during today's visit.    FERNAND FREDY RAMAN, MD  09/30/2024   This document may have been prepared by [facility] Voice Recognition software and as such may include unintentional dictation errors.      [1] No Known Allergies [2]  Outpatient Medications Prior to Visit  Medication Sig   famotidine (PEPCID) 20 MG tablet Take 20 mg by mouth 2 (two)  times daily. (Patient not taking: Reported on 09/30/2024)   fluticasone  (FLONASE ) 50 MCG/ACT nasal spray Place 1 spray into both nostrils daily. (Patient not taking: Reported on 09/30/2024)   omeprazole (PRILOSEC) 40 MG capsule Take 40 mg by mouth daily. (Patient not taking: Reported on 09/30/2024)   ondansetron  (ZOFRAN ) 4 MG tablet Take 4 mg by mouth every 6 (six) hours as needed. (Patient not taking: Reported on 09/30/2024)   No facility-administered medications prior to visit.
